# Patient Record
Sex: Female | Born: 1935
Health system: Southern US, Community
[De-identification: ages and names within clinical notes are randomized; demographics above are authoritative.]

## PROBLEM LIST (undated history)

## (undated) DIAGNOSIS — I2510 Atherosclerotic heart disease of native coronary artery without angina pectoris: Secondary | ICD-10-CM

## (undated) DIAGNOSIS — F0390 Unspecified dementia without behavioral disturbance: Secondary | ICD-10-CM

## (undated) DIAGNOSIS — F32A Depression, unspecified: Secondary | ICD-10-CM

## (undated) DIAGNOSIS — R6251 Failure to thrive (child): Secondary | ICD-10-CM

## (undated) DIAGNOSIS — M1990 Unspecified osteoarthritis, unspecified site: Secondary | ICD-10-CM

## (undated) DIAGNOSIS — K219 Gastro-esophageal reflux disease without esophagitis: Secondary | ICD-10-CM

## (undated) DIAGNOSIS — F329 Major depressive disorder, single episode, unspecified: Secondary | ICD-10-CM

## (undated) HISTORY — PX: APPENDECTOMY: SHX54

---

## 2001-11-09 ENCOUNTER — Encounter: Payer: Self-pay | Admitting: Internal Medicine

## 2001-11-09 ENCOUNTER — Encounter: Admission: RE | Admit: 2001-11-09 | Discharge: 2001-11-09 | Payer: Self-pay | Admitting: Internal Medicine

## 2001-11-28 ENCOUNTER — Encounter: Payer: Self-pay | Admitting: Internal Medicine

## 2001-11-28 ENCOUNTER — Encounter: Admission: RE | Admit: 2001-11-28 | Discharge: 2001-11-28 | Payer: Self-pay | Admitting: Internal Medicine

## 2003-07-29 ENCOUNTER — Encounter: Payer: Self-pay | Admitting: Internal Medicine

## 2003-07-29 ENCOUNTER — Encounter: Admission: RE | Admit: 2003-07-29 | Discharge: 2003-07-29 | Payer: Self-pay | Admitting: Internal Medicine

## 2004-02-10 ENCOUNTER — Inpatient Hospital Stay (HOSPITAL_BASED_OUTPATIENT_CLINIC_OR_DEPARTMENT_OTHER): Admission: RE | Admit: 2004-02-10 | Discharge: 2004-02-10 | Payer: Self-pay | Admitting: Cardiology

## 2004-03-04 ENCOUNTER — Inpatient Hospital Stay (HOSPITAL_COMMUNITY): Admission: RE | Admit: 2004-03-04 | Discharge: 2004-03-11 | Payer: Self-pay | Admitting: Surgery

## 2004-07-30 ENCOUNTER — Encounter: Admission: RE | Admit: 2004-07-30 | Discharge: 2004-07-30 | Payer: Self-pay | Admitting: Internal Medicine

## 2004-09-04 ENCOUNTER — Encounter: Admission: RE | Admit: 2004-09-04 | Discharge: 2004-09-04 | Payer: Self-pay | Admitting: Internal Medicine

## 2004-09-04 ENCOUNTER — Encounter (INDEPENDENT_AMBULATORY_CARE_PROVIDER_SITE_OTHER): Payer: Self-pay | Admitting: *Deleted

## 2004-09-11 ENCOUNTER — Encounter (HOSPITAL_COMMUNITY): Admission: RE | Admit: 2004-09-11 | Discharge: 2004-12-10 | Payer: Self-pay | Admitting: General Surgery

## 2004-09-23 ENCOUNTER — Encounter (INDEPENDENT_AMBULATORY_CARE_PROVIDER_SITE_OTHER): Payer: Self-pay | Admitting: General Surgery

## 2004-09-23 ENCOUNTER — Encounter (INDEPENDENT_AMBULATORY_CARE_PROVIDER_SITE_OTHER): Payer: Self-pay | Admitting: Specialist

## 2004-09-23 ENCOUNTER — Ambulatory Visit (HOSPITAL_COMMUNITY): Admission: RE | Admit: 2004-09-23 | Discharge: 2004-09-23 | Payer: Self-pay | Admitting: General Surgery

## 2004-09-23 ENCOUNTER — Ambulatory Visit (HOSPITAL_BASED_OUTPATIENT_CLINIC_OR_DEPARTMENT_OTHER): Admission: RE | Admit: 2004-09-23 | Discharge: 2004-09-23 | Payer: Self-pay | Admitting: General Surgery

## 2005-09-01 ENCOUNTER — Encounter: Admission: RE | Admit: 2005-09-01 | Discharge: 2005-09-01 | Payer: Self-pay | Admitting: General Surgery

## 2006-01-10 ENCOUNTER — Ambulatory Visit (HOSPITAL_COMMUNITY): Admission: RE | Admit: 2006-01-10 | Discharge: 2006-01-10 | Payer: Self-pay | Admitting: Internal Medicine

## 2006-08-31 ENCOUNTER — Encounter: Admission: RE | Admit: 2006-08-31 | Discharge: 2006-08-31 | Payer: Self-pay | Admitting: Internal Medicine

## 2007-09-04 ENCOUNTER — Encounter: Admission: RE | Admit: 2007-09-04 | Discharge: 2007-09-04 | Payer: Self-pay | Admitting: Internal Medicine

## 2008-03-13 ENCOUNTER — Ambulatory Visit: Payer: Self-pay | Admitting: Vascular Surgery

## 2008-04-30 ENCOUNTER — Emergency Department (HOSPITAL_BASED_OUTPATIENT_CLINIC_OR_DEPARTMENT_OTHER): Admission: EM | Admit: 2008-04-30 | Discharge: 2008-05-01 | Payer: Self-pay | Admitting: Emergency Medicine

## 2008-09-06 ENCOUNTER — Encounter: Admission: RE | Admit: 2008-09-06 | Discharge: 2008-09-06 | Payer: Self-pay | Admitting: Internal Medicine

## 2010-03-27 ENCOUNTER — Telehealth: Payer: Self-pay | Admitting: Gastroenterology

## 2010-11-14 ENCOUNTER — Encounter: Payer: Self-pay | Admitting: Vascular Surgery

## 2010-11-26 NOTE — Progress Notes (Signed)
Summary: Schedule NP3 to discuss Colonoscopy  Phone Note Outgoing Call Call back at [facility] Phone [phone]   Call placed by: Harlow Mares CMA Duncan Dull),  March 27, 2010 9:15 AM Call placed to: Patient Summary of Call: patient number rings a fast busy number. he is due for a colonoscopy but due to age he will need a office visit to discuss having his procedure.  Initial call taken by: Harlow Mares CMA Duncan Dull),  March 27, 2010 9:16 AM  Follow-up for Phone Call        pt number is busy  A letter will be mailed to the patient.   Follow-up by: Harlow Mares CMA Duncan Dull),  April 06, 2010 2:20 PM

## 2011-03-12 NOTE — Op Note (Signed)
[name], [name]                  ACCOUNT NO.:  [id number]   MEDICAL RECORD NO.:  [id number]          PATIENT TYPE:  AMB   LOCATION:  DSC                          FACILITY:  [facility]   PHYSICIAN:  Gita Kudo, M.D. DATE OF BIRTH:  05/21/1921   DATE OF PROCEDURE:  09/23/2004  DATE OF DISCHARGE:                                 OPERATIVE REPORT   PREOPERATIVE DIAGNOSIS:  Carcinoma, right breast.   POSTOPERATIVE DIAGNOSIS:  Carcinoma, right breast.   OPERATIVE PROCEDURE:  Right partial mastectomy.   SURGEON:  Gita Kudo, M.D.   ANESTHESIA:  General.   CLINICAL SUMMARY:  An 77 year old female with a lump in her breast, abnormal  mammogram, and a biopsy, core, showing at least DCIS, possible invasive.   OPERATIVE FINDINGS:  There was a firm 2-3 cm mass at about 7 o'clock in the  right breast.  It was removed widely.  I did not encounter the mass and had  a good margin of normal breast tissue in all directions.   OPERATIVE PROCEDURE:  Under satisfactory general endotracheal anesthesia,  the patient's breast was prepped and draped in a standard fashion.  A curved  incision made centered over the mass, which was deep in the breast tissue.  Then cautery was used to remove the mass with a surrounding rim of normal  breast tissue of at least 1-2 cm in all directions.  The specimen was marked  with clips and sent for pathologic exam, permanent.  The wound was then  lavaged with saline, made dry by cautery, and closed in layers with Vicryl  and staples.  A sterile dressing was applied, and she went to the recovery  room in good condition.       MRL/MEDQ  D:  09/23/2004  T:  09/23/2004  Job:  [id number]   cc:   Gwen Pounds, MD  Fax: [phone]

## 2011-03-12 NOTE — Discharge Summary (Signed)
NAMECRISTIANA, [name]                            ACCOUNT NO.:  [id number]   MEDICAL RECORD NO.:  [id number]                   PATIENT TYPE:  INP   LOCATION:  2037                                 FACILITY:  [facility]   PHYSICIAN:  Evelene Croon, M.D.                  DATE OF BIRTH:  05/21/1921   DATE OF ADMISSION:  03/04/2004  DATE OF DISCHARGE:  03/11/2004                           DISCHARGE SUMMARY - REFERRING   REFERRING PHYSICIAN:  Peter M. Swaziland, M.D.   PRIMARY ADMITTING DIAGNOSIS:  Chest pain.   ADDITIONAL/DISCHARGE DIAGNOSES:  1. Severe two-vessel coronary artery disease.  2. Hypertension.  3. Hypercholesterolemia.  4. Osteoporosis.  5. Peptic ulcer disease.  6. History of preoperative right bundle branch block.   PROCEDURES PERFORMED:  1. Coronary artery bypass grafting x2 (left internal mammary artery to the     LAD, saphenous vein graft to the right coronary artery).  2. Endoscopic vein harvest left thigh.   HISTORY OF PRESENT ILLNESS:  The patient is an 77 year old white female who,  in February of 2005, developed an episode of chest pain while shoveling some  snow and ice.  She described it as being substernal anterior chest pain with  numbness radiating into her arms.  This lasted a few minutes and resolved  without difficulty.  Since that time, she has had occasional episodes of  what she thought was indigestion which was relieved with belching.  Her  daughter has noticed that she gets short of breath with exertion.  She  recently underwent EKG which showed a left bundle branch block.  An  adenosine Cardiolite study was also performed and showed evidence of apical  ischemia.  Her left ventricular function was reduced with an ejection  fraction of 43% with severe anterior and apical hypokinesis.  Because of  this, she saw Dr. Peter Swaziland and underwent cardiac catheterization on  February 10, 2004.  She was found to have severe two vessel coronary artery  disease including  an 80 to 90% ostial LAD stenosis and 80 to 90% ostial  right coronary stenosis.  Ejection fraction was 45 to 50%.  She was not felt  to be a good candidate for percutaneous intervention.  She had remained  stable.  She was set up to see Dr. Evelene Croon as an outpatient for  consideration of surgical revascularization.  Dr. Laneta Simmers evaluated the  patient in the office and reviewed her catheterization films.  It was his  opinion that in order to decrease her risks for further ischemia or  infarction and to improve her quality of life, she would benefit from  coronary artery bypass surgery.  The patient and her family consented to  proceed after all the risks, benefits, and alternatives were explained to  them.  She was scheduled for outpatient admission on Mar 04, 2004, for  surgery.   HOSPITAL COURSE:  She was admitted  to [facility] on Mar 04, 2004, and was taken to the operating room where she underwent CABG x2 as  described in detail above.  She tolerated the procedure well and was  transferred to the SICU in stable condition.  She was able to be extubated  shortly after surgery.  She initially was DDD paced.  Her rhythm underlying  the pacer was complete heart block.  She had had a left bundle branch block  preoperatively and had developed postoperative right bundle branch block  which is fairly common postoperatively.  She was seen by cardiology and it  was elected to keep her on the pacer for observation.  She was otherwise  stable and doing well on postoperative day #1.  At that time her chest tubes  and hemodynamic monitoring devices were removed.  She was mobilized and  started on diuresis.  She remained in the ICU for further observation.  She  remained AV paced until postoperative day #3.  At that time she was weaned  from the pacer and it was discontinued.  Since that time, she has had no  further evidence of complete heart block.  She has been maintaining  normal  sinus rhythm.  She has otherwise done very well postoperatively.  Her only  postoperative complication besides her initial heart block has been  hypertension with blood pressures running in the 180 systolic range.  She  was started on Norvasc and her dose has been titrated upward.  She was also  started on Altace.  At the present, her blood pressure is better controlled  and she is maintaining around a 150 systolic.  She has maintained normal  sinus rhythm with rates in the 90s to 100s.  She has otherwise been afebrile  and all vital signs have been stable.  Her surgical incision sites are all  healing well.  She has been ambulating in the halls with cardiac rehab phase  I and is doing very well.  She is tolerating a regular diet and is having  normal bowel and bladder function.  She is diuresing well from her Lasix  dose.  Because she had been previously independent and lived alone prior to  surgery, it was felt that she would most likely benefit from a short course  at an assisted living facility upon discharge from the hospital.  Case  management has been working with her to find suitable placement and it is  felt she will be medically ready on Mar 11, 2004, provided that a bed is  ready at that time.   Most recent lab values showed hemoglobin 9.8, hematocrit 28.9, platelets  133, white count 10.8.  Potassium 4.2, sodium 141, BUN and creatinine 7 and  0.8 respectively.   DISCHARGE MEDICATIONS:  1. Enteric coated aspirin 325 mg daily.  2. Aciphex 30 mg daily.  3. Lipitor 10 mg daily.  4. Evista 60 mg daily.  5. Altace 10 mg daily.  6. Norvasc 5 mg daily.  7. Ultram 50 to 100 mg q.4-6h. p.r.n. for pain.   DISCHARGE INSTRUCTIONS:  1. She is to refrain from driving, heavy lifting, or strenuous activity.  2. She may continue ambulating daily and use her incentive spirometer.  3. She will continue a low fat, low sodium diet. 4. She may shower daily and clean her incisions  with soap and water.   FOLLOW UP:  She will see Dr. Swaziland back in two weeks and is to call and  schedule this appointment.  She will have a chest x-ray at that visit.  She  will then follow up with Evelene Croon, M.D., in three weeks.  She should  bring her chest x-ray to the office for Dr. Laneta Simmers to review.  In the  interim, if she experiences any problems or has questions, our office is to  be contacted immediately.      Coral Ceo, P.A.                        Evelene Croon, M.D.    GC/MEDQ  D:  03/10/2004  T:  03/10/2004  Job:  [id number]   cc:   Peter M. Swaziland, M.D.  [address], [address]  [place], [place] [address]  Fax: [phone]   Gwen Pounds, M.D.  [address]  [place]  [place] [address]  Fax: [phone]

## 2011-03-12 NOTE — H&P (Signed)
NAME:  [name], [name]                            ACCOUNT NO.:  [id number]   MEDICAL RECORD NO.:  [id number]                   PATIENT TYPE:  AMB   LOCATION:                                       FACILITY:  [facility]   PHYSICIAN:  Peter M. Swaziland, M.D.               DATE OF BIRTH:  05/21/1921   DATE OF ADMISSION:  02/10/2004  DATE OF DISCHARGE:                                HISTORY & PHYSICAL   HISTORY OF PRESENT ILLNESS:  [name] is a pleasant 77 year old white  female who presents after having an abnormal Adenosine Cardiolite study. She  states that she developed an episode of chest pain in February while she was  shoveling snow. This was anterior chest pain with numbness radiating into  her arms. She thought it was just indigestion and states that it only lasted  a few minutes. Since then, however, her daughters have noticed that she gets  short of breath when she goes up and down steps. She subsequently saw Dr.  Timothy Lasso at which time ECG showed a left bundle branch block. She was referred  for an Adenosine Cardiolite study, which demonstrated evidence of apical  ischemia. Left ventricular function was reduced with ejection fraction of  43% and severe anterior and apical hypokinesia. Based on these results  recommended that the patient undergo cardiac catheterization. She does have  risk factors of hypertension, hypercholesterolemia, and prior tobacco use.   PAST MEDICAL HISTORY:  1. Hypertension.  2. Hypercholesterolemia.  3. Osteoporosis.  4. History of peptic ulcer disease.  5. Status post appendectomy.  6. Status post oophorectomy.   ALLERGIES:  No known drug allergies.   CURRENT MEDICATIONS:  1. Lipitor 10 mg per day.  2. Evista 60 mg per day.  3. Aciphex 30 mg per day.  4. Altace 10 mg per day.   SOCIAL HISTORY:  The patient is retired. She is widowed. She lives alone and  takes care of her own housework and enjoys doing her own yard work as well.  She quit smoking a  year ago, but states she only smoked about a pack a  month. She denies alcohol use.   FAMILY HISTORY:  The patient is an orphan and does not know her parent's  history. She had one brother who died with Alzheimer's disease. One son has  had coronary artery bypass surgery.   REVIEW OF SYSTEMS:  The patient denies any history of TIA or stroke. She  denies claudication. She has had no edema or orthopnea. Denies any bowel or  bladder complaints. Other review of systems are negative.   PHYSICAL EXAMINATION:  GENERAL:  The patient is an elderly, pleasant, white  female in no apparent distress.  VITAL SIGNS:  Weight is 120, blood pressure 150/80, pulse 64 and regular.  HEENT:  Pupils equal, round, and reactive to light and accommodation. She  has prominent bags  under her eyes that are slightly erythematous.  Conjunctivae are clear. oropharynx is clear.  NECK:  Supple without JVD, adenopathy, thyromegaly, or bruits.  LUNGS:  Clear to auscultation and percussion.  BACK:  Kyphotic.  CARDIAC:  Regular rate and rhythm without gallops, murmurs, rubs, or clicks.  ABDOMEN:  Soft, nontender. No hepatosplenomegaly, masses, or bruits.  EXTREMITIES:  Pedal pulses are 2+ and symmetric.  NEUROLOGIC:  Nonfocal.   LABORATORY DATA:  Chest x-ray shows kyphosis. There is osteoporosis. No  active disease. ECG shows normal sinus rhythm with a left bundle branch  block.   IMPRESSION:  1. Abnormal Adenosine Cardiolite study consistent with apical ischemia.  2. One episode of exertional chest pain.  3. Dyspnea on exertion.  4. Hypertension.  5. Hypercholesterolemia.  6. Prior history of tobacco use.   PLAN:  The patient will undergo cardiac catheterization with further therapy  pending these results.                                                Peter M. Swaziland, M.D.    PMJ/MEDQ  D:  02/04/2004  T:  02/04/2004  Job:  [id number]   cc:   Gwen Pounds, M.D.  [address]  [place]  [place] [address]   Fax: [phone]

## 2011-03-12 NOTE — Cardiovascular Report (Signed)
NAME:  [name], [name]                            ACCOUNT NO.:  [id number]   MEDICAL RECORD NO.:  [id number]                   PATIENT TYPE:  OIB   LOCATION:  6501                                 FACILITY:  [facility]   PHYSICIAN:  Peter M. Swaziland, M.D.               DATE OF BIRTH:  05/21/1921   DATE OF PROCEDURE:  02/10/2004  DATE OF DISCHARGE:  02/10/2004                              CARDIAC CATHETERIZATION   INDICATION FOR PROCEDURE:  The patient is an 77 year old white female who  presents with recent onset of chest pain.  She has a left bundle branch  block.  She has history of hypertension and hypercholesterolemia, prior  history of tobacco abuse.  Recent adenosine Cardiolite study was abnormal  showing marked apical ischemia and ejection fraction of 43%.   ANGIOGRAPHIC DATA:  The left coronary artery arises and distributes  normally.  The left main coronary artery is normal.   The left anterior descending artery has a slit-like stenosis at the ostium  estimated at 80-90%.  The remainder of the vessel is without significant  disease.   The left circumflex coronary artery is a very large vessel which gives rise  to three marginal branches which all appeared to be free of disease.   The right coronary artery is a dominant vessel.  It distributes normal.  It  has an 80-90% ostial stenosis.  The remainder of the vessel is without  significant disease.   Left ventricular angiography performed in the RAO view demonstrates normal  left ventricular size with severe apical hypokinesia.  Overall, left  ventricular systolic function was mildly reduced with ejection fraction  estimated at 45-50%.  There is no mitral regurgitation or prolapse.   Aortic root angiography demonstrates mildly enlarged aortic root without  aortic insufficiency.   FINAL INTERPRETATION:  1. Severe two-vessel obstructive atherosclerotic coronary artery disease     involving the ostium and the left anterior  descending and the ostial     right coronary artery.  2. Mild left ventricular dysfunction.   PLAN:  Given the complexity and position of her severe stenosis, I think she  would be a poor candidate for catheter based intervention.  Will consider a  coronary artery bypass graft.   ACCESS:  Via the right femoral artery using standard Seldinger technique.   EQUIPMENT:  5 French 4 cm right and left Judkins catheter, 5 French pigtail  catheter, 5 French arterial sheath.   MEDICATIONS:  Local anesthesia with 1% Xylocaine, Versed 1 mg IV.   CONTRAST:  150 mL of Omnipaque.   HEMODYNAMIC DATA:  Aortic pressure 199/74 with mean of 122.  Left  ventricular pressure was 203 with EDP of 13 mmHg.  Peter M. Swaziland, M.D.    PMJ/MEDQ  D:  02/10/2004  T:  02/10/2004  Job:  [id number]   cc:   Gwen Pounds, M.D.  [address]  [place]  [place] [address]  Fax: [phone]   CVTS

## 2011-03-12 NOTE — Op Note (Signed)
[name], [name]                            ACCOUNT NO.:  [id number]   MEDICAL RECORD NO.:  [id number]                   PATIENT TYPE:  INP   LOCATION:  2316                                 FACILITY:  [facility]   PHYSICIAN:  Evelene Croon, M.D.                  DATE OF BIRTH:  05/21/1921   DATE OF PROCEDURE:  03/04/2004  DATE OF DISCHARGE:                                 OPERATIVE REPORT   PREOPERATIVE DIAGNOSIS:  Severe two vessel coronary artery disease.   POSTOPERATIVE DIAGNOSIS:  Severe two vessel coronary artery disease.   PROCEDURE:  Median sternotomy, extracorporal peripheral circulation,  coronary artery bypass graft surgery x2 using a left internal mammary artery  graft to the left anterior descending coronary artery, with a saphenous vein  graft to the right coronary artery.  Endoscopic vein harvesting from the  left leg.   ATTENDING SURGEON:  Dr. Evelene Croon.   ASSISTANT:  Shonna Chock, P.A.-C.   ANESTHESIA:  General endotracheal.   CLINICAL HISTORY:  This patient is an 77 year old active white female who  developed chest pain in February while shoveling some snow and ice.  She  subsequently has had further episodes of chest pain.  An Adenosine  Cardiolite scan showed evidence of apical ischemia.  Left ventricular  function was reduced with an ejection fraction of 43% with severe anterior  and apical hypokinesis.  She subsequently underwent cardiac catheterization  on February 10, 2004.  This showed severe two vessel disease with 80-90% osteal  LAD stenosis and an 80-90% osteal right coronary artery stenosis.  Ejection  fraction was 45-50%.  There was no mitral regurgitation and no significant  aortic stenosis.  After review of the angiogram and examination of the  patient, it was felt that coronary artery bypass graft surgery was the best  treatment.  I discussed the operative procedure with the patient and her  daughter and granddaughter including alternatives,  benefits, and risks  including bleeding, blood transfusion, infection, stroke, myocardial  infarction, graft failure, and death.  They understood and agreed to  proceed.   OPERATIVE PROCEDURE:  The patient was taken to the operating room and placed  on the table in the supine position.  After induction of general  endotracheal anesthesia, a Foley catheter was placed in the bladder using  sterile technique.  Then the chest, abdomen and both lower extremities were  prepped and draped in the usual sterile manner.  The chest was entered  through a median sternotomy incision and the pericardium opened in the  midline.  Examination of the heart showed good ventricular contractility.  The ascending aorta had no palpable plaques in it.   Then the left internal mammary artery was harvested from the chest wall as a  pedicle graft.  This was a medium caliber vessel with excellent blood flow  through it.  At the same time, a 7  mm greater saphenous vein was harvested  from the left leg using endoscopic vein harvest technique.  This vein was a  medium size and good quality.   Then the patient was heparinized and when an adequate activated clotting  time was achieved, the distal ascending aorta was cannulated using a 20  French aortic cannula for arterial inflow.  Venous outflow was achieved  using a two stage venous cannula through the right atrial appendage.  An  antegrade cardioplegia and bent cannula was inserted in the aortic root.   The patient was placed on cardiopulmonary bypass and the distal coronary was  identified.  The LAD was a large graftable vessel proximally and then  narrowed in its mid and distal portions.  There was no significant distal  disease in it.  The right coronary artery was a large graftable vessel that  had some patchy plaque in the mid portion.   Then the aorta was cross clamped and 500 cc of cold blood antegrade  cardioplegia was administered into the aortic root  with quick arrest of the  heart.  Systemic hypothermia to 20 degrees Centigrade and topical  hypothermic ice saline was used.  A temperature probe was placed in the  septum, insulated and padded in the pericardium.   The first distal anastomosis was performed to the mid portion of the right  coronary artery.  The internal diameter of this vessel was greater than 2.5  mm.  The conduit used was a 7 mm greater saphenous vein.  The anastomosis  was performed in an end-to-side manner using continuous 7-0 Prolene suture.  Flow was admitted through the graft and was excellent.   The second distal anastomosis was performed of the mid portion of the left  anterior descending coronary artery.  The internal diameter of this vessel  was about 2 mm.  The conduit used was a left internal mammary graft and this  was brought through an opening in the left pericardium anterior to the  phrenic nerve.  It was anastomosed to the LAD in an end-to-side manner using  continuous 8-0 Prolene suture.  The pedicle was tacked to the epicardium  with 6-0 Prolene sutures.  The patient was rewarmed to 37 degrees  Centigrade.  Then the single proximal vein graft anastomosis was performed  at the aortic root in an end-to-side manner using continuous 6-0 Prolene  suture.  The clamp was removed, the vein graft de-aired and clamps removed  from them.  The proximal and distal anastomosis appeared hemostatic and the  lie of the grafts satisfactory.  Graft markers were placed around the  proximal anastomosis.  Two temporary right ventricular and right atrial  pacing wires were placed and brought out through the skin.   When the patient had rewarmed to 37 degrees Centigrade, she was weaned from  cardiopulmonary bypass on no inotropic agents.  The total bypass time was 59  minutes.  Protamine was given and the venous and aortic cannulas were removed without difficulty.  Hemostasis was achieved.  Three chest tubes  were placed  with two in the posterior pericardium, one in the left pleural  space, one in the anterior mediastinum.  The pericardium was reapproximated  over the heart.  The sternum was closed with #6 stainless steel wires.  The  fascia was closed with continuous #1 Vicryl suture.  The subcutaneous tissue  was closed with continuous 2-0 Vicryl and the skin with 3-0 Vicryl  subcuticular closure.  The lower extremity vein  harvest site was closed in  layers in a similar manner.  The sponge, needle and instrument counts were  correct per the scrub nurse.  Dry sterile dressing were applied over the  incisions and around the chest tube which were hooked to Pleur-evac suction.  The patient remained hemodynamically stable and was transported to the SICU  in guarded but stable condition.                                               Evelene Croon, M.D.    BB/MEDQ  D:  03/04/2004  T:  03/05/2004  Job:  [id number]   cc:   Peter M. Swaziland, M.D.  [address], [address]  [place], [place] [address]  Fax: [phone]   Cardiac catherization lab at [facility]

## 2012-02-19 ENCOUNTER — Encounter (HOSPITAL_COMMUNITY): Payer: Self-pay | Admitting: *Deleted

## 2012-02-19 ENCOUNTER — Emergency Department (HOSPITAL_COMMUNITY)
Admission: EM | Admit: 2012-02-19 | Discharge: 2012-02-19 | Disposition: A | Discharge status: Home / Self Care | Payer: Medicare Other | Attending: Emergency Medicine | Admitting: Emergency Medicine

## 2012-02-19 DIAGNOSIS — R609 Edema, unspecified: Secondary | ICD-10-CM | POA: Insufficient documentation

## 2012-02-19 DIAGNOSIS — L02419 Cutaneous abscess of limb, unspecified: Secondary | ICD-10-CM | POA: Insufficient documentation

## 2012-02-19 DIAGNOSIS — M79609 Pain in unspecified limb: Secondary | ICD-10-CM

## 2012-02-19 DIAGNOSIS — L03115 Cellulitis of right lower limb: Secondary | ICD-10-CM

## 2012-02-19 DIAGNOSIS — K219 Gastro-esophageal reflux disease without esophagitis: Secondary | ICD-10-CM | POA: Insufficient documentation

## 2012-02-19 HISTORY — DX: Atherosclerotic heart disease of native coronary artery without angina pectoris: I25.10

## 2012-02-19 HISTORY — DX: Failure to thrive (child): R62.51

## 2012-02-19 HISTORY — DX: Unspecified dementia, unspecified severity, without behavioral disturbance, psychotic disturbance, mood disturbance, and anxiety: F03.90

## 2012-02-19 HISTORY — DX: Depression, unspecified: F32.A

## 2012-02-19 HISTORY — DX: Major depressive disorder, single episode, unspecified: F32.9

## 2012-02-19 HISTORY — DX: Gastro-esophageal reflux disease without esophagitis: K21.9

## 2012-02-19 MED ORDER — MORPHINE SULFATE 4 MG/ML IJ SOLN
6.0000 mg | Freq: Once | INTRAMUSCULAR | Status: DC
  Filled 2012-02-19: qty 1

## 2012-02-19 MED ORDER — SODIUM CHLORIDE 0.9 % IV BOLUS (SEPSIS)
500.0000 mL | Freq: Once | INTRAVENOUS | Status: AC
  Administered 2012-02-19: 500 mL via INTRAVENOUS

## 2012-02-19 MED ORDER — VANCOMYCIN HCL IN DEXTROSE 1-5 GM/200ML-% IV SOLN
1000.0000 mg | Freq: Once | INTRAVENOUS | Status: AC
  Administered 2012-02-19: 1000 mg via INTRAVENOUS
  Filled 2012-02-19 (×2): qty 200

## 2012-02-19 MED ORDER — ONDANSETRON HCL 4 MG/2ML IJ SOLN
4.0000 mg | Freq: Once | INTRAMUSCULAR | Status: AC
  Administered 2012-02-19: 4 mg via INTRAVENOUS
  Filled 2012-02-19: qty 2

## 2012-02-19 MED ORDER — SULFAMETHOXAZOLE-TRIMETHOPRIM 800-160 MG PO TABS: 1.0000 | ORAL_TABLET | Freq: Two times a day (BID) | ORAL | Status: AC

## 2012-02-19 MED ORDER — MORPHINE SULFATE 4 MG/ML IJ SOLN
4.0000 mg | Freq: Once | INTRAMUSCULAR | Status: AC
  Administered 2012-02-19: 4 mg via INTRAVENOUS

## 2012-02-19 NOTE — ED Provider Notes (Signed)
History    89yF with RLE pain. Gradual onset yesterday evening.  Partially worsening. Pain is in the right shin area and does not radiate. Patient denies trauma. Mild swelling. Mild rash in the area. No fever or chills. No acute numbness, weakness or loss of strength.  Pt with chronic LE pain but current complaints is acutely worse. Worse with ambulation and palpation. Recent RLE swelling which resolved. Had outpt [place] to eval for this and negative per family.   CSN: [id number]  Arrival date & time 02/19/12  1100   First MD Initiated Contact with Patient 02/19/12 1138      Chief Complaint  Patient presents with  . Leg Pain    (Consider location/radiation/quality/duration/timing/severity/associated sxs/prior treatment) Patient is a 78 y.o. female presenting with leg pain. The history is provided by the patient and a relative. History Limited By: dementia. No language interpreter was used.  Leg Pain  The incident occurred yesterday. There was no injury mechanism. The pain is present in the right leg. The quality of the pain is described as aching. The pain is moderate. The pain has been constant since onset. Pertinent negatives include no inability to bear weight and no muscle weakness. The symptoms are aggravated by bearing weight.    Past Medical History  Diagnosis Date  . Depression   . GERD (gastroesophageal reflux disease)   . Dementia   . CAD (coronary artery disease)   . Cardiomyopathy   . Osteoporosis   . Constipation   . Failure to thrive     History reviewed. No pertinent past surgical history.  History reviewed. No pertinent family history.  History  Substance Use Topics  . Smoking status: Not on file  . Smokeless tobacco: Not on file  . Alcohol Use:     OB History    Grav Para Term Preterm Abortions TAB SAB Ect Mult Living                  Review of Systems   Review of symptoms negative unless otherwise noted in HPI.   Allergies  Review of patient's  allergies indicates no known allergies.  Home Medications   Current Outpatient Rx  Name Route Sig Dispense Refill  . ACETAMINOPHEN 325 MG PO TABS Oral Take 325-650 mg by mouth every 6 (six) hours as needed. For pain.    . ASPIRIN 81 MG PO CHEW Oral Chew 81 mg by mouth every morning.    Marland Kitchen CLOTRIMAZOLE-BETAMETHASONE 1-0.05 % EX CREA Topical Apply 1 application topically 2 (two) times daily. Applies under breasts until resolved.    . DONEPEZIL HCL 5 MG PO TABS Oral Take 5 mg by mouth at bedtime.    . FUROSEMIDE 20 MG PO TABS Oral Take 20 mg by mouth every morning.    Marland Kitchen HALOPERIDOL 1 MG PO TABS Oral Take 1 mg by mouth at bedtime.    Marland Kitchen LOSARTAN POTASSIUM 50 MG PO TABS Oral Take 50 mg by mouth every morning.    Marland Kitchen OMEPRAZOLE 20 MG PO CPDR Oral Take 20 mg by mouth every morning.    Marland Kitchen RALOXIFENE HCL 60 MG PO TABS Oral Take 60 mg by mouth every morning.    Marland Kitchen SERTRALINE HCL 25 MG PO TABS Oral Take 25 mg by mouth at bedtime.      BP 174/61  Pulse 86  Temp(Src) 98.8 F (37.1 C) (Oral)  Resp 18  SpO2 97%  Physical Exam  Nursing note and vitals reviewed. Constitutional: She appears  well-developed and well-nourished. No distress.       Laying in bed. NAD. Pleasantly demented.  HENT:  Head: Normocephalic and atraumatic.  Eyes: Conjunctivae are normal. Right eye exhibits no discharge. Left eye exhibits no discharge.  Neck: Neck supple.  Cardiovascular: Normal rate, regular rhythm and normal heart sounds.  Exam reveals no gallop and no friction rub.   No murmur heard. Pulmonary/Chest: Effort normal and breath sounds normal. No respiratory distress.  Abdominal: Soft. She exhibits no distension. There is no tenderness.  Musculoskeletal: She exhibits edema and tenderness.       Mild edema of RLE below knee. Mild erythema of proximal shin and extending laterally with tenderness and increased warmth. Extends from area of tibial tuberosity to distal third of tib/fib. No knee effusion or tenderness. No  significant pain with ROM of knee.  Neurological: She is alert.  Skin: Skin is warm and dry. She is not diaphoretic.  Psychiatric: She has a normal mood and affect. Her behavior is normal. Thought content normal.    ED Course  Procedures (including critical care time)  Labs Reviewed - No data to display No results found.   1. Cellulitis of right leg       MDM  89yF with RLE pain. Suspect cellulitis. No hx of trauma. Pt with small abrasion overlying bunion R foot and recent "scraping between her toes by foot doctor" which would be portal of entry. Recent swelling RLE particularly around feet which has improved per grand daughter. Has [place] RLE in evaluation of this about 4w ago which was normal per their report. Will repeat US to eval for DVT, although feel less likely. Do not feel XR or other imaging of much utility at this time. IV for pain meds and dose IV abx if [place] neg. Feel that can be appropriately be discharged for tx as an outpatient at this time.        Raeford Razor, MD 02/19/12 [phone]

## 2012-02-19 NOTE — ED Notes (Signed)
Pt in via EMS, c/o right leg pain and left leg pain, denies fall or injury, states she has continued ambulating during pain, states she has chronic left leg pain, denies arm pain

## 2012-02-19 NOTE — Progress Notes (Signed)
VASCULAR LAB PRELIMINARY  PRELIMINARY  PRELIMINARY  PRELIMINARY  Right lower extremity venous Doppler completed.    Preliminary report:  There is no DVT or SVT noted in the right lower extremity.  Sherren Kerns [place], 02/19/2012, 1:14 PM

## 2012-02-19 NOTE — ED Notes (Signed)
Bed:WA10<BR> Expected date:<BR> Expected time:<BR> Means of arrival:<BR> Comments:<BR>

## 2012-02-19 NOTE — Discharge Instructions (Signed)
Cellulitis Cellulitis is an infection of the tissue under the skin. The infected area is usually red and tender. This is caused by germs. These germs enter the body through cuts or sores. This usually happens in the arms or lower legs. HOME CARE   Take your medicine as told. Finish it even if you start to feel better.   If the infection is on the arm or leg, keep it raised (elevated).   Use a warm cloth on the infected area several times a day.   See your doctor for a follow-up visit as told.  GET HELP RIGHT AWAY IF:   You are tired or confused.   You throw up (vomit).   You have watery poop (diarrhea).   You feel ill and have muscle aches.   You have a fever.  MAKE SURE YOU:   Understand these instructions.   Will watch your condition.   Will get help right away if you are not doing well or get worse.  Document Released: 03/29/2008 Document Revised: 09/30/2011 Document Reviewed: 09/12/2009 [facility] Patient Information 2012 [place], [place].  RESOURCE GUIDE  Dental Problems  Patients with Medicaid: [facility] [phone] [address]                                           [phone] [address] Phone:  [phone]                                                  Phone:  [phone]  If unable to pay or uninsured, contact:  [facility] or [facility]. to become qualified for the adult dental clinic.  Chronic Pain Problems Contact [facility]  [phone] Patients need to be referred by their primary care doctor.  Insufficient Money for Medicine Contact [employer]:  call "211" or [facility] [phone].  No Primary Care Doctor Call [facility]  [phone] Other agencies that provide inexpensive medical care    [facility]  [phone]    [facility]  [phone]    [facility]  [phone]    [facility]  [phone]    [facility]   [phone]    [facility]  [phone]  Psychological Services [facility]  [phone] [facility]  [phone] [facility]   [phone] (emergency services [phone])  Substance Abuse Resources [facility]  [phone] [facility] [phone] [facility] [place] [phone] Floydene Flock [phone] Residential & Outpatient Substance Abuse Program  [phone]  Abuse/Neglect [facility] Child Abuse Hotline [phone] [facility] Child Abuse Hotline [phone] (After Hours)  Emergency Shelter [facility] [phone]  Maternity Homes Room at the [place] [facility] [place] [phone] [facility] [phone]  MRSA Hotline #:   575-645-0572    [facility]  [facility] Dept. [address]      [place] Phone:  [phone]                                   Phone:  [phone]                 Phone:  [place] Phone:  [place] [phone] (After Hours)

## 2012-02-19 NOTE — ED Notes (Signed)
Urine in lab.  Lab called and acknowledged

## 2012-03-28 ENCOUNTER — Encounter: Payer: Self-pay | Admitting: *Deleted

## 2012-05-09 ENCOUNTER — Encounter: Payer: Self-pay | Admitting: Gastroenterology

## 2012-08-03 ENCOUNTER — Encounter: Payer: Self-pay | Admitting: Cardiology

## 2012-12-07 ENCOUNTER — Emergency Department (HOSPITAL_COMMUNITY): Payer: Medicare Other

## 2012-12-07 ENCOUNTER — Inpatient Hospital Stay (HOSPITAL_COMMUNITY)
Admission: EM | Admit: 2012-12-07 | Discharge: 2012-12-12 | DRG: 603 | Disposition: A | Discharge status: Home Health | Payer: Medicare Other | Attending: Internal Medicine | Admitting: Internal Medicine

## 2012-12-07 ENCOUNTER — Encounter (HOSPITAL_COMMUNITY): Payer: Self-pay | Admitting: *Deleted

## 2012-12-07 DIAGNOSIS — I2510 Atherosclerotic heart disease of native coronary artery without angina pectoris: Secondary | ICD-10-CM | POA: Diagnosis present

## 2012-12-07 DIAGNOSIS — Z853 Personal history of malignant neoplasm of breast: Secondary | ICD-10-CM

## 2012-12-07 DIAGNOSIS — K5900 Constipation, unspecified: Secondary | ICD-10-CM | POA: Diagnosis present

## 2012-12-07 DIAGNOSIS — L02619 Cutaneous abscess of unspecified foot: Principal | ICD-10-CM | POA: Diagnosis present

## 2012-12-07 DIAGNOSIS — I10 Essential (primary) hypertension: Secondary | ICD-10-CM | POA: Diagnosis present

## 2012-12-07 DIAGNOSIS — L03039 Cellulitis of unspecified toe: Principal | ICD-10-CM | POA: Diagnosis present

## 2012-12-07 DIAGNOSIS — M869 Osteomyelitis, unspecified: Secondary | ICD-10-CM

## 2012-12-07 DIAGNOSIS — L03116 Cellulitis of left lower limb: Secondary | ICD-10-CM

## 2012-12-07 DIAGNOSIS — Z66 Do not resuscitate: Secondary | ICD-10-CM | POA: Diagnosis present

## 2012-12-07 DIAGNOSIS — K219 Gastro-esophageal reflux disease without esophagitis: Secondary | ICD-10-CM | POA: Diagnosis present

## 2012-12-07 DIAGNOSIS — I428 Other cardiomyopathies: Secondary | ICD-10-CM | POA: Diagnosis present

## 2012-12-07 DIAGNOSIS — F329 Major depressive disorder, single episode, unspecified: Secondary | ICD-10-CM | POA: Diagnosis present

## 2012-12-07 DIAGNOSIS — F0390 Unspecified dementia without behavioral disturbance: Secondary | ICD-10-CM | POA: Diagnosis present

## 2012-12-07 DIAGNOSIS — E785 Hyperlipidemia, unspecified: Secondary | ICD-10-CM | POA: Diagnosis present

## 2012-12-07 DIAGNOSIS — I509 Heart failure, unspecified: Secondary | ICD-10-CM | POA: Diagnosis present

## 2012-12-07 DIAGNOSIS — D649 Anemia, unspecified: Secondary | ICD-10-CM | POA: Diagnosis present

## 2012-12-07 DIAGNOSIS — F3289 Other specified depressive episodes: Secondary | ICD-10-CM | POA: Diagnosis present

## 2012-12-07 DIAGNOSIS — Z951 Presence of aortocoronary bypass graft: Secondary | ICD-10-CM

## 2012-12-07 DIAGNOSIS — N179 Acute kidney failure, unspecified: Secondary | ICD-10-CM | POA: Diagnosis present

## 2012-12-07 LAB — BASIC METABOLIC PANEL
BUN: 32 mg/dL — ABNORMAL HIGH (ref 6–23)
CO2: 23 mEq/L (ref 19–32)
Calcium: 8.7 mg/dL (ref 8.4–10.5)
Chloride: 97 mEq/L (ref 96–112)
Creatinine, Ser: 1.56 mg/dL — ABNORMAL HIGH (ref 0.50–1.10)
GFR calc Af Amer: 33 mL/min — ABNORMAL LOW (ref >90)
GFR calc non Af Amer: 28 mL/min — ABNORMAL LOW (ref >90)
Glucose, Bld: 92 mg/dL (ref 70–99)
Potassium: 3.4 mEq/L — ABNORMAL LOW (ref 3.5–5.1)
Sodium: 133 mEq/L — ABNORMAL LOW (ref 135–145)

## 2012-12-07 LAB — CBC
HCT: 33.6 % — ABNORMAL LOW (ref 36.0–46.0)
Hemoglobin: 11.1 g/dL — ABNORMAL LOW (ref 12.0–15.0)
MCH: 31.1 pg (ref 26.0–34.0)
MCHC: 33 g/dL (ref 30.0–36.0)
MCV: 94.1 fL (ref 78.0–100.0)
Platelets: 219 10*3/uL (ref 150–400)
RBC: 3.57 MIL/uL — ABNORMAL LOW (ref 3.87–5.11)
RDW: 13.9 % (ref 11.5–15.5)
WBC: 8.8 10*3/uL (ref 4.0–10.5)

## 2012-12-07 MED ORDER — FUROSEMIDE 20 MG PO TABS: 20.0000 mg | ORAL_TABLET | Freq: Two times a day (BID) | ORAL | Status: DC

## 2012-12-07 MED ORDER — SODIUM CHLORIDE 0.9 % IV SOLN: INTRAVENOUS | Status: DC

## 2012-12-07 MED ORDER — HYDROCODONE-ACETAMINOPHEN 5-325 MG PO TABS
1.0000 | ORAL_TABLET | ORAL | Status: DC | PRN
  Administered 2012-12-08 – 2012-12-11 (×5): 1 via ORAL
  Filled 2012-12-07: qty 2
  Filled 2012-12-07 (×4): qty 1

## 2012-12-07 MED ORDER — SERTRALINE HCL 25 MG PO TABS
25.0000 mg | ORAL_TABLET | Freq: Every day | ORAL | Status: DC
  Administered 2012-12-07 – 2012-12-11 (×5): 25 mg via ORAL
  Filled 2012-12-07 (×6): qty 1

## 2012-12-07 MED ORDER — VANCOMYCIN HCL 1000 MG IV SOLR
750.0000 mg | INTRAVENOUS | Status: DC
  Administered 2012-12-08: 750 mg via INTRAVENOUS
  Filled 2012-12-07: qty 750

## 2012-12-07 MED ORDER — MORPHINE SULFATE 4 MG/ML IJ SOLN
4.0000 mg | Freq: Once | INTRAMUSCULAR | Status: AC
  Administered 2012-12-07: 4 mg via INTRAVENOUS
  Filled 2012-12-07: qty 1

## 2012-12-07 MED ORDER — LOSARTAN POTASSIUM 50 MG PO TABS
50.0000 mg | ORAL_TABLET | Freq: Every morning | ORAL | Status: DC
  Administered 2012-12-08 – 2012-12-12 (×5): 50 mg via ORAL
  Filled 2012-12-07 (×5): qty 1

## 2012-12-07 MED ORDER — POLYETHYLENE GLYCOL 3350 17 G PO PACK
17.0000 g | PACK | Freq: Every day | ORAL | Status: DC | PRN
  Filled 2012-12-07: qty 1

## 2012-12-07 MED ORDER — ACETAMINOPHEN 325 MG PO TABS: 650.0000 mg | ORAL_TABLET | Freq: Four times a day (QID) | ORAL | Status: DC | PRN

## 2012-12-07 MED ORDER — MUPIROCIN 2 % EX OINT
1.0000 "application " | TOPICAL_OINTMENT | Freq: Two times a day (BID) | CUTANEOUS | Status: DC
  Administered 2012-12-07 – 2012-12-11 (×9): 1 via NASAL
  Filled 2012-12-07: qty 22

## 2012-12-07 MED ORDER — FUROSEMIDE 20 MG PO TABS
20.0000 mg | ORAL_TABLET | ORAL | Status: DC
  Administered 2012-12-08 – 2012-12-12 (×5): 20 mg via ORAL
  Filled 2012-12-07 (×6): qty 1

## 2012-12-07 MED ORDER — ALUM & MAG HYDROXIDE-SIMETH 200-200-20 MG/5ML PO SUSP: 30.0000 mL | Freq: Four times a day (QID) | ORAL | Status: DC | PRN

## 2012-12-07 MED ORDER — VANCOMYCIN HCL IN DEXTROSE 1-5 GM/200ML-% IV SOLN
1000.0000 mg | Freq: Once | INTRAVENOUS | Status: AC
  Administered 2012-12-07: 1000 mg via INTRAVENOUS
  Filled 2012-12-07: qty 200

## 2012-12-07 MED ORDER — CHLORHEXIDINE GLUCONATE CLOTH 2 % EX PADS
6.0000 | MEDICATED_PAD | Freq: Every day | CUTANEOUS | Status: DC
  Administered 2012-12-08 – 2012-12-11 (×4): 6 via TOPICAL

## 2012-12-07 MED ORDER — PANTOPRAZOLE SODIUM 40 MG PO TBEC
40.0000 mg | DELAYED_RELEASE_TABLET | Freq: Every day | ORAL | Status: DC
  Administered 2012-12-08 – 2012-12-12 (×5): 40 mg via ORAL
  Filled 2012-12-07 (×5): qty 1

## 2012-12-07 MED ORDER — SODIUM CHLORIDE 0.9 % IV SOLN
INTRAVENOUS | Status: DC
  Administered 2012-12-07: 250 mL via INTRAVENOUS
  Administered 2012-12-07: 1000 mL via INTRAVENOUS
  Administered 2012-12-08 – 2012-12-10 (×4): via INTRAVENOUS

## 2012-12-07 MED ORDER — ACETAMINOPHEN 650 MG RE SUPP: 650.0000 mg | Freq: Four times a day (QID) | RECTAL | Status: DC | PRN

## 2012-12-07 MED ORDER — DOCUSATE SODIUM 100 MG PO CAPS
100.0000 mg | ORAL_CAPSULE | Freq: Two times a day (BID) | ORAL | Status: DC
  Administered 2012-12-07 – 2012-12-10 (×7): 100 mg via ORAL
  Filled 2012-12-07 (×11): qty 1

## 2012-12-07 MED ORDER — PIPERACILLIN-TAZOBACTAM 3.375 G IVPB
3.3750 g | Freq: Three times a day (TID) | INTRAVENOUS | Status: DC
  Administered 2012-12-07 – 2012-12-08 (×2): 3.375 g via INTRAVENOUS
  Filled 2012-12-07 (×3): qty 50

## 2012-12-07 MED ORDER — RALOXIFENE HCL 60 MG PO TABS
60.0000 mg | ORAL_TABLET | Freq: Every morning | ORAL | Status: DC
  Administered 2012-12-08 – 2012-12-12 (×5): 60 mg via ORAL
  Filled 2012-12-07 (×5): qty 1

## 2012-12-07 MED ORDER — SENNA 8.6 MG PO TABS
1.0000 | ORAL_TABLET | Freq: Two times a day (BID) | ORAL | Status: DC
  Administered 2012-12-07 – 2012-12-10 (×6): 8.6 mg via ORAL
  Filled 2012-12-07 (×6): qty 1

## 2012-12-07 MED ORDER — PIPERACILLIN-TAZOBACTAM 3.375 G IVPB 30 MIN
3.3750 g | Freq: Once | INTRAVENOUS | Status: AC
  Administered 2012-12-07: 3.375 g via INTRAVENOUS
  Filled 2012-12-07: qty 50

## 2012-12-07 MED ORDER — SODIUM CHLORIDE 0.9 % IJ SOLN
3.0000 mL | Freq: Two times a day (BID) | INTRAMUSCULAR | Status: DC
  Administered 2012-12-09 – 2012-12-11 (×3): 3 mL via INTRAVENOUS

## 2012-12-07 MED ORDER — HALOPERIDOL 1 MG PO TABS
1.0000 mg | ORAL_TABLET | Freq: Every day | ORAL | Status: DC
  Administered 2012-12-07 – 2012-12-11 (×5): 1 mg via ORAL
  Filled 2012-12-07 (×6): qty 1

## 2012-12-07 MED ORDER — ASPIRIN EC 81 MG PO TBEC
81.0000 mg | DELAYED_RELEASE_TABLET | Freq: Every day | ORAL | Status: DC
  Administered 2012-12-08 – 2012-12-12 (×5): 81 mg via ORAL
  Filled 2012-12-07 (×6): qty 1

## 2012-12-07 MED ORDER — ONDANSETRON HCL 4 MG/2ML IJ SOLN: 4.0000 mg | Freq: Four times a day (QID) | INTRAMUSCULAR | Status: DC | PRN

## 2012-12-07 MED ORDER — FUROSEMIDE 40 MG PO TABS
40.0000 mg | ORAL_TABLET | ORAL | Status: DC
  Administered 2012-12-08 – 2012-12-12 (×4): 40 mg via ORAL
  Filled 2012-12-07 (×6): qty 1

## 2012-12-07 MED ORDER — DONEPEZIL HCL 5 MG PO TABS
5.0000 mg | ORAL_TABLET | Freq: Every day | ORAL | Status: DC
  Administered 2012-12-07 – 2012-12-11 (×5): 5 mg via ORAL
  Filled 2012-12-07 (×6): qty 1

## 2012-12-07 MED ORDER — ONDANSETRON HCL 4 MG/2ML IJ SOLN
4.0000 mg | Freq: Once | INTRAMUSCULAR | Status: AC
  Administered 2012-12-07: 4 mg via INTRAVENOUS
  Filled 2012-12-07: qty 2

## 2012-12-07 MED ORDER — ONDANSETRON HCL 4 MG/2ML IJ SOLN: 4.0000 mg | Freq: Three times a day (TID) | INTRAMUSCULAR | Status: DC | PRN

## 2012-12-07 MED ORDER — SODIUM CHLORIDE 0.9 % IV BOLUS (SEPSIS)
500.0000 mL | Freq: Once | INTRAVENOUS | Status: AC
  Administered 2012-12-07: 14:00:00 via INTRAVENOUS

## 2012-12-07 MED ORDER — ONDANSETRON HCL 4 MG PO TABS: 4.0000 mg | ORAL_TABLET | Freq: Four times a day (QID) | ORAL | Status: DC | PRN

## 2012-12-07 NOTE — Progress Notes (Signed)
ANTIBIOTIC CONSULT NOTE - INITIAL  Pharmacy Consult for Vancomycin and Zosyn Indication: Osteomyelitis  Allergies  Allergen Reactions  . Tramadol    Patient Measurements: Height: 5\' 4"  (162.6 cm) Weight: 121 lb (54.885 kg) IBW/kg (Calculated) : 54.7  Vital Signs: Temp: 98 F (36.7 C) (02/13 1541) Temp src: Oral (02/13 1541) BP: 142/62 mmHg (02/13 1541) Pulse Rate: 91 (02/13 1541) Intake/Output from previous day:   Intake/Output from this shift:    Labs:  Recent Labs  12/07/12 1300  WBC 8.8  HGB 11.1*  PLT 219  CREATININE 1.56*   Estimated Creatinine Clearance: 20.7 ml/min (by C-G formula based on Cr of 1.56). No results found for this basename: VANCOTROUGH, VANCOPEAK, VANCORANDOM, GENTTROUGH, GENTPEAK, GENTRANDOM, TOBRATROUGH, TOBRAPEAK, TOBRARND, AMIKACINPEAK, AMIKACINTROU, AMIKACIN,  in the last 72 hours   Microbiology: No results found for this or any previous visit (from the past 720 hour(s)).  Medical History: Past Medical History  Diagnosis Date  . Depression   . GERD (gastroesophageal reflux disease)   . Dementia   . CAD (coronary artery disease)   . Cardiomyopathy   . Osteoporosis   . Constipation   . Failure to thrive in childhood    Medications:  Anti-infectives   Start     Dose/Rate Route Frequency Ordered Stop   12/07/12 1300  vancomycin (VANCOCIN) IVPB 1000 mg/200 mL premix     1,000 mg 200 mL/hr over 60 Minutes Intravenous  Once 12/07/12 1246 12/07/12 1505   12/07/12 1300  piperacillin-tazobactam (ZOSYN) IVPB 3.375 g     3.375 g 100 mL/hr over 30 Minutes Intravenous  Once 12/07/12 1246 12/07/12 1434     Assessment:  79yo F admitted from NH with L 4th toe wound.  Starting empiric Vanc and Zosyn for suspected osteomyelitis.  Elevated SCr thought to have pre-renal component.  Goal of Therapy:  Vancomycin trough level 15-20 mcg/ml  Plan:  Vancomycin 750mg  IV q24h, starting 2/14. Zosyn 3.375g IV Q8H infused over 4hrs. Measure Vanc  trough at steady state. Follow up renal fxn and culture results.  Charolotte Eke, PharmD, pager [phone]. 12/07/2012,4:10 PM.

## 2012-12-07 NOTE — ED Provider Notes (Signed)
History    89yf with L foot pain and swelling. Pt reports ulceration to top of fourth toe L foot from rubbing on shoe. Onset about a week ago. Initially just localized pain, but in last couple days increasing swelling, pain and redness in foot and ankle. No fever or chills. No n/v. No hx of diabetes.    CSN: [id number]  Arrival date & time 12/07/12  1225   First MD Initiated Contact with Patient 12/07/12 1230      Chief Complaint  Patient presents with  . Leg Swelling    (Consider location/radiation/quality/duration/timing/severity/associated sxs/prior treatment) HPI  Past Medical History  Diagnosis Date  . Depression   . GERD (gastroesophageal reflux disease)   . Dementia   . CAD (coronary artery disease)   . Cardiomyopathy   . Osteoporosis   . Constipation   . Failure to thrive in childhood     Past Surgical History  Procedure Laterality Date  . Appendectomy      Family History  Problem Relation Age of Onset  . Alzheimer's disease    . Coronary artery disease      History  Substance Use Topics  . Smoking status: Former Smoker    Quit date: 10/25/2002  . Smokeless tobacco: Not on file  . Alcohol Use: Not on file    OB History   Grav Para Term Preterm Abortions TAB SAB Ect Mult Living                  Review of Systems  All systems reviewed and negative, other than as noted in HPI.   Allergies  Tramadol  Home Medications   Current Outpatient Rx  Name  Route  Sig  Dispense  Refill  . acetaminophen (TYLENOL) 325 MG tablet   Oral   Take 325-650 mg by mouth every 6 (six) hours as needed. For pain.         Marland Kitchen aspirin 81 MG chewable tablet   Oral   Chew 81 mg by mouth every morning.         . diclofenac sodium (VOLTAREN) 1 % GEL   Topical   Apply 2 g topically 2 (two) times daily. Apply to right knee         . donepezil (ARICEPT) 5 MG tablet   Oral   Take 5 mg by mouth at bedtime.         . fluconazole (DIFLUCAN) 100 MG tablet  Oral   Take 100 mg by mouth every other day. x7 doses         . furosemide (LASIX) 20 MG tablet   Oral   Take 20-40 mg by mouth 2 (two) times daily. 2 tablets (40mg ) at 0600, 1 tablet (20mg ) at noon         . haloperidol (HALDOL) 1 MG tablet   Oral   Take 1 mg by mouth at bedtime.         Marland Kitchen losartan (COZAAR) 50 MG tablet   Oral   Take 50 mg by mouth every morning.         Marland Kitchen omeprazole (PRILOSEC) 20 MG capsule   Oral   Take 20 mg by mouth every morning.         . raloxifene (EVISTA) 60 MG tablet   Oral   Take 60 mg by mouth every morning.         . sertraline (ZOLOFT) 25 MG tablet   Oral   Take 25 mg by  mouth at bedtime.           There were no vitals taken for this visit.  Physical Exam  Nursing note and vitals reviewed. Constitutional: She appears well-developed and well-nourished. No distress.  HENT:  Head: Normocephalic and atraumatic.  Eyes: Conjunctivae are normal. Right eye exhibits no discharge. Left eye exhibits no discharge.  Neck: Neck supple.  Cardiovascular: Normal rate, regular rhythm and normal heart sounds.  Exam reveals no gallop and no friction rub.   No murmur heard. Pulmonary/Chest: Effort normal and breath sounds normal. No respiratory distress.  Abdominal: Soft. She exhibits no distension. There is no tenderness.  Musculoskeletal: She exhibits no edema and no tenderness.  Wound to dorsal aspect L 4th toe. Purulence. Wound probes to bone. Pitting edema of foot and distal leg. Increased warmth, erythema and tenderness of foot and ankle.   Neurological: She is alert.  Skin: Skin is warm and dry.  Psychiatric: She has a normal mood and affect. Her behavior is normal. Thought content normal.    ED Course  Procedures (including critical care time)  Labs Reviewed  CBC - Abnormal; Notable for the following:    RBC 3.57 (*)    Hemoglobin 11.1 (*)    HCT 33.6 (*)    All other components within normal limits  BASIC METABOLIC PANEL -  Abnormal; Notable for the following:    Sodium 133 (*)    Potassium 3.4 (*)    BUN 32 (*)    Creatinine, Ser 1.56 (*)    GFR calc non Af Amer 28 (*)    GFR calc Af Amer 33 (*)    All other components within normal limits   Dg Toe 4th Left  12/07/2012  *RADIOLOGY REPORT*  Clinical Data: Pain and swelling fourth toe  LEFT FOURTH TOE  Comparison: None.  Findings: Four views of the left fourth toe submitted.  No acute fracture or subluxation.  There is diffuse osteopenia.  There is old fracture deformity distal fifth metatarsal.  No radiopaque foreign body is identified.  No definite bone destruction. Soft tissue swelling fourth toe.  IMPRESSION: No acute fracture or subluxation.  Markedly limited study by diffuse osteopenia.  No definite bone destruction. Soft tissue swelling fourth toe.   Original Report Authenticated By: Natasha Mead, M.D.      1. Osteomyelitis of toe of left foot   2. Cellulitis of left lower leg       MDM  89yf with L foot/ankle swelling and pain. Pt clinically with osteomyelitis of 4th toe with deep tracking of wound with bone palpable with probing. Cellulitis of foot/lower leg likely as extension of this.    2:29 PM Discussed with Dr Alveda Reasons, orthopedic surgery.       Raeford Razor, MD 12/07/12 1440

## 2012-12-07 NOTE — ED Notes (Signed)
Per EMS- Patient is a resident of [facility]. Staff noticed that the patient had redness and swelling of left foot and an ulcerated area. Edema to right ankle also. Staff also reported that the patient has had a history of cellulitis to the right ankle

## 2012-12-07 NOTE — Progress Notes (Signed)
CRITICAL VALUE ALERT  Critical value received: MRSA PCR  Date of notification:  21314  Time of notification:  1820  Critical value read back:yes  Nurse who received alert:  Orlene Och, RN  MD notified (1st page):  Per protocol. MD not paged.  Time of first page:    MD notified (2nd page):  Time of second page:  Responding MD:    Time MD responded:

## 2012-12-07 NOTE — ED Notes (Signed)
Bed:WHALA<BR> Expected date:<BR> Expected time:<BR> Means of arrival:<BR> Comments:<BR> ems

## 2012-12-07 NOTE — H&P (Signed)
Physician Admission History and Physical     PCP:   Katie Pounds, MD   Chief Complaint:  L 4th toe wound   HPI: [name] is an 79 y.o. female.  Pt presents from a facility w/ a L 4th toe wound. Found to be able to be probed to the bone. XRay showed soft tissue swelling but no def bone involvement, but poor study.  Started on abx and ortho c/s'd. Will hydrate w/ IVFs given some AKI likely 2/2 dehydration given low Na, Cl as well.   Review of Systems:  Denies chills, fevers, and only what is stated above are the pertinent positives   Past History Past Medical History (reviewed - no changes required): HTN, CAD/CABG  2005,  Cardiomyopathy/CHF,  Hyperlipidemia,   LBBB,   Breast Cancer-09/23/04,    Broken Wrist 04/2008,    Polyps,     Diverticulosis,   ,  H/O TIA,   Constipation,   OA/Arthralgias,   Osteoporosis,   GERD,   Anemia,  Vit D Insuff,   Early to Mod Dementia with sundowning/Confusion/Delerium,  Seb Cyst S/P I&D,  Adult Failure To Thrive,   UTIs,   BLE edema,  Electrolyte issues,   Relative Anorexia and Malnutrition,  Depression,  High Freq hearing Loss, Bleeding Ulcer 1995 Surgical History (reviewed - no changes required): Breast Cancer-R partial mastectomy 2005 CABG-03/04/04-L phrenic n paresis L hemidiaphraghm R-Cataract L-Cataract 3/09 Family History (reviewed - no changes required): No family history-raised in orphanage. 1 brother died-alzheimers Social History (reviewed - no changes required): Widowed 1971 4 children, 6 grandchildren, 4 great grandchildren Lives on same road as daughter on same property - Now on [employer]. Tobacco use d/ced yrs ago est 1980--1 pk per week    Complete Medication List: 1)  Depend Adjustable Underwear Lg Misc (Incontinence supply disposable) .... Depends pullups (not peal off) 2-3 per day 2)  Furosemide 20 Mg Tabs (Furosemide) .... One q monday. 3)  Benicar 40 Mg Tabs (Olmesartan medoxomil) .... Once daily 4)  Prilosec 20 Mg Cpdr  (Omeprazole) .... Take one daily 5)  Evista 60 Mg Tabs (Raloxifene hcl) .Marland Kitchen.. 1 po daily 6)  Haloperidol 1 Mg Tabs (Haloperidol) .... One p.o. q.h.s. p.r.n. 7)  Aricept 5 Mg Tabs (Donepezil hcl) .... One po hs 8)  Losartan Potassium 50 Mg Tabs (Losartan potassium) .... Take one by mouth once a day 9)  Zoloft 25 Mg Tabs (Sertraline hcl) .... One per day 10)  Miralax Powd (Polyethylene glycol 3350) 11)  Aspirin 81 Mg Chw Tab (Aspirin) .Marland Kitchen.. 1 po qd   Physical Exam: Filed Vitals:   12/07/12 1251  BP: 139/80  Pulse: 84  Temp: 98 F (36.7 C)  TempSrc: Oral  Resp: 17  SpO2: 95%   Gen: poorly nourished WF in NAD  HEENT: PERRL, EOMI, dry MM  Lungs:CTAB, no wheezes/rales  Cardio:RRR no MRG  ZOX:WRUE, nontender, + BS  Ext: small deep lesion on the L 4th tarsal. No clubbing, cyanosis or edema  Neuro: dementia baseline per the family. No focal weakness   Labs on Admission:   Recent Labs  12/07/12 1300  NA 133*  K 3.4*  CL 97  CO2 23  GLUCOSE 92  BUN 32*  CREATININE 1.56*  CALCIUM 8.7   No results found for this basename: AST, ALT, ALKPHOS, BILITOT, PROT, ALBUMIN,  in the last 72 hours No results found for this basename: LIPASE, AMYLASE,  in the last 72 hours  Recent Labs  12/07/12  1300  WBC 8.8  HGB 11.1*  HCT 33.6*  MCV 94.1  PLT 219   No results found for this basename: CKTOTAL, CKMB, CKMBINDEX, TROPONINI,  in the last 72 hours No results found for this basename: INR,  PROTIME   No results found for this basename: TSH, T4TOTAL, FREET3, T3FREE, THYROIDAB,  in the last 72 hours No results found for this basename: VITAMINB12, FOLATE, FERRITIN, TIBC, IRON, RETICCTPCT,  in the last 72 hours  Radiological Exams on Admission: Dg Toe 4th Left  12/07/2012  *RADIOLOGY REPORT*  Clinical Data: Pain and swelling fourth toe  LEFT FOURTH TOE  Comparison: None.  Findings: Four views of the left fourth toe submitted.  No acute fracture or subluxation.  There is diffuse osteopenia.   There is old fracture deformity distal fifth metatarsal.  No radiopaque foreign body is identified.  No definite bone destruction. Soft tissue swelling fourth toe.  IMPRESSION: No acute fracture or subluxation.  Markedly limited study by diffuse osteopenia.  No definite bone destruction. Soft tissue swelling fourth toe.   Original Report Authenticated By: [name], M.D.    No orders found for this or any previous visit.  Assessment/Plan Principal Problem:   Osteomyelitis - location is the L 4th toe. Dx criteria is being able to probe bone on the lesion. Started on vanc and zosyn in Ed and this will be continued per pharmacy c/s. NS at 100cc/hr started.  ED MD Discussed with Dr [name], orthopedic surgery. We appreciate their recs. Otherwise I will continue her home meds and avoid lovenox for DVT ppx at this time until determined if patient requires any type of operation. Cont ASA 81. Cardiac diet. All home meds continued. Tele.   [name] 12/07/2012, 2:22 PM

## 2012-12-08 LAB — BASIC METABOLIC PANEL
BUN: 24 mg/dL — ABNORMAL HIGH (ref 6–23)
CO2: 26 mEq/L (ref 19–32)
Chloride: 105 mEq/L (ref 96–112)
Creatinine, Ser: 1.6 mg/dL — ABNORMAL HIGH (ref 0.50–1.10)
Glucose, Bld: 100 mg/dL — ABNORMAL HIGH (ref 70–99)

## 2012-12-08 LAB — CBC
HCT: 31.1 % — ABNORMAL LOW (ref 36.0–46.0)
MCH: 30.9 pg (ref 26.0–34.0)
MCHC: 32.2 g/dL (ref 30.0–36.0)
MCV: 96 fL (ref 78.0–100.0)
RDW: 14.1 % (ref 11.5–15.5)

## 2012-12-08 MED ORDER — POTASSIUM CHLORIDE CRYS ER 20 MEQ PO TBCR
20.0000 meq | EXTENDED_RELEASE_TABLET | Freq: Every day | ORAL | Status: DC
  Administered 2012-12-08 – 2012-12-09 (×2): 20 meq via ORAL
  Filled 2012-12-08 (×3): qty 1

## 2012-12-08 MED ORDER — PIPERACILLIN-TAZOBACTAM IN DEX 2-0.25 GM/50ML IV SOLN
2.2500 g | Freq: Four times a day (QID) | INTRAVENOUS | Status: DC
  Administered 2012-12-08 – 2012-12-09 (×5): 2.25 g via INTRAVENOUS
  Filled 2012-12-08 (×9): qty 50

## 2012-12-08 MED ORDER — ENSURE COMPLETE PO LIQD
237.0000 mL | Freq: Two times a day (BID) | ORAL | Status: DC
  Administered 2012-12-08 – 2012-12-10 (×4): 237 mL via ORAL

## 2012-12-08 MED ORDER — VANCOMYCIN HCL 500 MG IV SOLR
500.0000 mg | INTRAVENOUS | Status: DC
  Administered 2012-12-09 – 2012-12-10 (×2): 500 mg via INTRAVENOUS
  Filled 2012-12-08 (×3): qty 500

## 2012-12-08 NOTE — Consult Note (Signed)
Seen this am with her daughter and daughter who is a Engineer, civil (consulting).  She has erythema/cellulitis involving her left forefoot and an open sore over the PIP joint of the left 4th toe.  The x-rays are negative.  I think this is primarily a cellulitis and may not really be a n osteomyelitis.  Certainly if toe amputation is necessary, it should not bee done until the cueelulitis is under better control.  I have explained all this to the family and that she will not be followed subsequently by me as I will be out of town.  Dr. Dion Saucier will probably be seeing her.

## 2012-12-08 NOTE — Progress Notes (Addendum)
Scab came off wound on left toe and wound open and draining purulent drainage. Obtained culture ( will check with MD about it if want  to order) . Cleanse area with normal saline and applied wet to dry with Normal saline over area.

## 2012-12-08 NOTE — Progress Notes (Signed)
ANTIBIOTIC CONSULT NOTE - INITIAL  Pharmacy Consult for Vancomycin and Zosyn Indication: Osteomyelitis vs cellulitis   Allergies  Allergen Reactions  . Tramadol    Patient Measurements: Height: 5\' 4"  (162.6 cm) Weight: 121 lb (54.885 kg) IBW/kg (Calculated) : 54.7  Vital Signs: Temp: 97.6 F (36.4 C) (02/14 0437) Temp src: Oral (02/14 0437) BP: 125/50 mmHg (02/14 0437) Pulse Rate: 83 (02/14 0437) Intake/Output from previous day: 02/13 0701 - 02/14 0700 In: 2028.7 [P.O.:440; I.V.:1488.7; IV Piggyback:100] Out: 975 [Urine:975] Intake/Output from this shift:    Labs:  Recent Labs  12/07/12 1300 12/08/12 0407  WBC 8.8 6.0  HGB 11.1* 10.0*  PLT 219 192  CREATININE 1.56* 1.60*   Estimated Creatinine Clearance: 20.2 ml/min (by C-G formula based on Cr of 1.6). No results found for this basename: VANCOTROUGH, Leodis Binet, VANCORANDOM, GENTTROUGH, GENTPEAK, GENTRANDOM, TOBRATROUGH, TOBRAPEAK, TOBRARND, AMIKACINPEAK, AMIKACINTROU, AMIKACIN,  in the last 72 hours   Microbiology: Recent Results (from the past 720 hour(s))  MRSA PCR SCREENING     Status: Abnormal   Collection Time    12/07/12  4:28 PM      Result Value Range Status   MRSA by PCR POSITIVE (*) NEGATIVE Final   Comment:            The GeneXpert MRSA Assay (FDA     approved for NASAL specimens     only), is one component of a     comprehensive MRSA colonization     surveillance program. It is not     intended to diagnose MRSA     infection nor to guide or     monitor treatment for     MRSA infections.     RESULT CALLED TO, READ BACK BY AND VERIFIED WITH:     THOMAS,C. AT 1822 ON [id number] BY LOVE,T.    Medical History: Past Medical History  Diagnosis Date  . Depression   . GERD (gastroesophageal reflux disease)   . Dementia   . CAD (coronary artery disease)   . Cardiomyopathy   . Osteoporosis   . Constipation   . Failure to thrive in childhood    Medications:  Anti-infectives   Start     Dose/Rate  Route Frequency Ordered Stop   12/09/12 1000  vancomycin (VANCOCIN) 500 mg in sodium chloride 0.9 % 100 mL IVPB     500 mg 100 mL/hr over 60 Minutes Intravenous Every 24 hours 12/08/12 1112     12/08/12 1200  piperacillin-tazobactam (ZOSYN) IVPB 2.25 g     2.25 g 100 mL/hr over 30 Minutes Intravenous 4 times per day 12/08/12 1111     12/08/12 1000  vancomycin (VANCOCIN) 750 mg in sodium chloride 0.9 % 150 mL IVPB  Status:  Discontinued     750 mg 150 mL/hr over 60 Minutes Intravenous Every 24 hours 12/07/12 1611 12/08/12 1112   12/07/12 2200  piperacillin-tazobactam (ZOSYN) IVPB 3.375 g  Status:  Discontinued     3.375 g 12.5 mL/hr over 240 Minutes Intravenous 3 times per day 12/07/12 1611 12/08/12 1111   12/07/12 1300  vancomycin (VANCOCIN) IVPB 1000 mg/200 mL premix     1,000 mg 200 mL/hr over 60 Minutes Intravenous  Once 12/07/12 1246 12/07/12 1505   12/07/12 1300  piperacillin-tazobactam (ZOSYN) IVPB 3.375 g     3.375 g 100 mL/hr over 30 Minutes Intravenous  Once 12/07/12 1246 12/07/12 1434     Assessment:  79yo F admitted from NH with L 4th toe wound with  cellulitis vs. Osteomyelitis on D#2 empiric antibiotics with vancomycin and zosyn  Will reduce Zosyn and vancomycin doses based on CrCl 20 ml/min with slight bump in Scr.   Will monitor renal function   AF, WBC WNL, No micro pending   Goal of Therapy:  Vancomycin trough level 15-20 mcg/ml  Plan:  Reduce Vancomycin to 500 mg IV q24h Reduce Zosyn to 2.25 grams IV q6h Measure Vanc trough at steady state. Follow up renal fxn and culture results.  Lavenia Stumpo, Loma Messing PharmD Pager #: [phone] 11:22 AM 12/08/2012 ]

## 2012-12-08 NOTE — Progress Notes (Signed)
Paged MD on call for Dr Timothy Lasso about wound culture ( if needed) [facility] stated we did not need one.

## 2012-12-08 NOTE — Progress Notes (Addendum)
Subjective: Admitted with Toe ulcer = Osteomyelitis - on IV Abx Scab came off wound on left toe this am and wound open and draining purulent drainage. Area cleansed with normal saline and applied wet to dry with Normal saline over area.  Seen by Ortho this am and will be followed by Dr Osie Bond over the weekend. No C/O - resting comfortable in bed.  Objective: Vital signs in last 24 hours: Temp:  [97.6 F (36.4 C)-99.7 F (37.6 C)] 97.6 F (36.4 C) (02/14 0437) Pulse Rate:  [72-91] 83 (02/14 0437) Resp:  [16-18] 18 (02/14 0437) BP: (111-142)/(50-62) 125/50 mmHg (02/14 0437) SpO2:  [92 %-94 %] 94 % (02/14 0437) Weight:  [54.885 kg (121 lb)] 54.885 kg (121 lb) (02/13 1541) Weight change:  Last BM Date: 12/06/12  CBG (last 3)  No results found for this basename: GLUCAP,  in the last 72 hours  Intake/Output from previous day:  Intake/Output Summary (Last 24 hours) at 12/08/12 1336 Last data filed at 12/08/12 0612  Gross per 24 hour  Intake 2028.67 ml  Output    975 ml  Net 1053.67 ml   02/13 0701 - 02/14 0700 In: 2028.7 [P.O.:440; I.V.:1488.7; IV Piggyback:100] Out: 975 [Urine:975]   Physical Exam  General appearance: elderly lady in no distress. Eyes: no scleral icterus Throat: oropharynx moist without erythema Resp: CTA Cardio: Reg, m GI: soft, non-tender; bowel sounds normal; no masses,  no organomegaly Extremities: L foot cellulitis and L 4th toe ulcer with foul smelling purulent drainage.   Lab Results:  Recent Labs  12/07/12 1300 12/08/12 0407  NA 133* 139  K 3.4* 3.7  CL 97 105  CO2 23 26  GLUCOSE 92 100*  BUN 32* 24*  CREATININE 1.56* 1.60*  CALCIUM 8.7 8.2*    No results found for this basename: AST, ALT, ALKPHOS, BILITOT, PROT, ALBUMIN,  in the last 72 hours   Recent Labs  12/07/12 1300 12/08/12 0407  WBC 8.8 6.0  HGB 11.1* 10.0*  HCT 33.6* 31.1*  MCV 94.1 96.0  PLT 219 192    No results found for this basename: INR, PROTIME    No  results found for this basename: CKTOTAL, CKMB, CKMBINDEX, TROPONINI,  in the last 72 hours  No results found for this basename: TSH, T4TOTAL, FREET3, T3FREE, THYROIDAB,  in the last 72 hours  No results found for this basename: VITAMINB12, FOLATE, FERRITIN, TIBC, IRON, RETICCTPCT,  in the last 72 hours  Micro Results: Recent Results (from the past 240 hour(s))  MRSA PCR SCREENING     Status: Abnormal   Collection Time    12/07/12  4:28 PM      Result Value Range Status   MRSA by PCR POSITIVE (*) NEGATIVE Final   Comment:            The GeneXpert MRSA Assay (FDA     approved for NASAL specimens     only), is one component of a     comprehensive MRSA colonization     surveillance program. It is not     intended to diagnose MRSA     infection nor to guide or     monitor treatment for     MRSA infections.     RESULT CALLED TO, READ BACK BY AND VERIFIED WITH:     THOMAS,C. AT 1822 ON [id number] BY LOVE,T.     Studies/Results: Dg Toe 4th Left  12/07/2012  *RADIOLOGY REPORT*  Clinical Data: Pain and swelling fourth toe  LEFT FOURTH TOE  Comparison: None.  Findings: Four views of the left fourth toe submitted.  No acute fracture or subluxation.  There is diffuse osteopenia.  There is old fracture deformity distal fifth metatarsal.  No radiopaque foreign body is identified.  No definite bone destruction. Soft tissue swelling fourth toe.  IMPRESSION: No acute fracture or subluxation.  Markedly limited study by diffuse osteopenia.  No definite bone destruction. Soft tissue swelling fourth toe.   Original Report Authenticated By: Natasha Mead, M.D.      Medications: Scheduled: . aspirin EC  81 mg Oral Daily  . Chlorhexidine Gluconate Cloth  6 each Topical Q0600  . docusate sodium  100 mg Oral BID  . donepezil  5 mg Oral QHS  . furosemide  20 mg Oral Q24H  . furosemide  40 mg Oral Q24H  . haloperidol  1 mg Oral QHS  . losartan  50 mg Oral q morning - 10a  . mupirocin ointment  1 application  Nasal BID  . pantoprazole  40 mg Oral Daily  . piperacillin-tazobactam (ZOSYN)  IV  2.25 g Intravenous Q6H  . raloxifene  60 mg Oral q morning - 10a  . senna  1 tablet Oral BID  . sertraline  25 mg Oral QHS  . sodium chloride  3 mL Intravenous Q12H  . [START ON 12/09/2012] vancomycin  500 mg Intravenous Q24H   Continuous: . sodium chloride 100 mL/hr at 12/08/12 4540     Assessment/Plan: Principal Problem:   Osteomyelitis  Cellulitis c presumed Osteo - x ray (-) and already seen by Otho who does not feel amputation is necessary at this point.  IV Abx will be given and followed by me and Dr Osie Bond over the weekend and decide on further plan.  Wound Care consult and Dressing changes on order.  If needs Amputation, hopefully will just be the toe.  Electrolyte Issues. - Replete K  HTN - BP fine  Anemia - Hbg OK - Follow.  Dementia - On Meds.  AKI Cr stable to a little better  CAD/CABG - No Angina  ID -  Anti-infectives   Start     Dose/Rate Route Frequency Ordered Stop   12/09/12 1000  vancomycin (VANCOCIN) 500 mg in sodium chloride 0.9 % 100 mL IVPB     500 mg 100 mL/hr over 60 Minutes Intravenous Every 24 hours 12/08/12 1112     12/08/12 1200  piperacillin-tazobactam (ZOSYN) IVPB 2.25 g     2.25 g 100 mL/hr over 30 Minutes Intravenous 4 times per day 12/08/12 1111     12/08/12 1000  vancomycin (VANCOCIN) 750 mg in sodium chloride 0.9 % 150 mL IVPB  Status:  Discontinued     750 mg 150 mL/hr over 60 Minutes Intravenous Every 24 hours 12/07/12 1611 12/08/12 1112   12/07/12 2200  piperacillin-tazobactam (ZOSYN) IVPB 3.375 g  Status:  Discontinued     3.375 g 12.5 mL/hr over 240 Minutes Intravenous 3 times per day 12/07/12 1611 12/08/12 1111   12/07/12 1300  vancomycin (VANCOCIN) IVPB 1000 mg/200 mL premix     1,000 mg 200 mL/hr over 60 Minutes Intravenous  Once 12/07/12 1246 12/07/12 1505   12/07/12 1300  piperacillin-tazobactam (ZOSYN) IVPB 3.375 g     3.375 g 100 mL/hr  over 30 Minutes Intravenous  Once 12/07/12 1246 12/07/12 1434     DVT Prophylaxis    LOS: 1 day   Aniella Wandrey M 12/08/2012, 1:36 PM

## 2012-12-08 NOTE — Consult Note (Signed)
Reason for Consult: Ulcer left foot fourth toe with cellulitis Referring Physician: Dr. Tobias Schwartz is an 79 y.o. female.  HPI: Patient presents with a chronic ulcer over the left foot fourth toe PIP joint.  Past Medical History  Diagnosis Date  . Depression   . GERD (gastroesophageal reflux disease)   . Dementia   . CAD (coronary artery disease)   . Cardiomyopathy   . Osteoporosis   . Constipation   . Failure to thrive in childhood     Past Surgical History  Procedure Laterality Date  . Appendectomy      Family History  Problem Relation Age of Onset  . Alzheimer's disease    . Coronary artery disease      Social History:  reports that she quit smoking about 10 years ago. She has never used smokeless tobacco. She reports that she does not drink alcohol or use illicit drugs.  Allergies:  Allergies  Allergen Reactions  . Tramadol     Medications: I have reviewed the patient's current medications.  Results for orders placed during the hospital encounter of 12/07/12 (from the past 48 hour(s))  CBC     Status: Abnormal   Collection Time    12/07/12  1:00 PM      Result Value Range   WBC 8.8  4.0 - 10.5 K/uL   RBC 3.57 (*) 3.87 - 5.11 MIL/uL   Hemoglobin 11.1 (*) 12.0 - 15.0 g/dL   HCT 16.1 (*) 09.6 - 04.5 %   MCV 94.1  78.0 - 100.0 fL   MCH 31.1  26.0 - 34.0 pg   MCHC 33.0  30.0 - 36.0 g/dL   RDW 40.9  81.1 - 91.4 %   Platelets 219  150 - 400 K/uL  BASIC METABOLIC PANEL     Status: Abnormal   Collection Time    12/07/12  1:00 PM      Result Value Range   Sodium 133 (*) 135 - 145 mEq/L   Potassium 3.4 (*) 3.5 - 5.1 mEq/L   Chloride 97  96 - 112 mEq/L   CO2 23  19 - 32 mEq/L   Glucose, Bld 92  70 - 99 mg/dL   BUN 32 (*) 6 - 23 mg/dL   Creatinine, Ser 7.82 (*) 0.50 - 1.10 mg/dL   Calcium 8.7  8.4 - 95.6 mg/dL   GFR calc non Af Amer 28 (*) >90 mL/min   GFR calc Af Amer 33 (*) >90 mL/min   Comment:            The eGFR has been calculated     using  the CKD EPI equation.     This calculation has not been     validated in all clinical     situations.     eGFR's persistently     <90 mL/min signify     possible Chronic Kidney Disease.  MRSA PCR SCREENING     Status: Abnormal   Collection Time    12/07/12  4:28 PM      Result Value Range   MRSA by PCR POSITIVE (*) NEGATIVE   Comment:            The GeneXpert MRSA Assay (FDA     approved for NASAL specimens     only), is one component of a     comprehensive MRSA colonization     surveillance program. It is not     intended to diagnose MRSA  infection nor to guide or     monitor treatment for     MRSA infections.     RESULT CALLED TO, READ BACK BY AND VERIFIED WITH:     [name]. AT 1822 ON [id number] BY LOVE,T.  BASIC METABOLIC PANEL     Status: Abnormal   Collection Time    12/08/12  4:07 AM      Result Value Range   Sodium 139  135 - 145 mEq/L   Potassium 3.7  3.5 - 5.1 mEq/L   Chloride 105  96 - 112 mEq/L   Comment: RESULT REPEATED AND VERIFIED     DELTA CHECK NOTED   CO2 26  19 - 32 mEq/L   Glucose, Bld 100 (*) 70 - 99 mg/dL   BUN 24 (*) 6 - 23 mg/dL   Creatinine, Ser 2.95 (*) 0.50 - 1.10 mg/dL   Calcium 8.2 (*) 8.4 - 10.5 mg/dL   GFR calc non Af Amer 27 (*) >90 mL/min   GFR calc Af Amer 32 (*) >90 mL/min   Comment:            The eGFR has been calculated     using the CKD EPI equation.     This calculation has not been     validated in all clinical     situations.     eGFR's persistently     <90 mL/min signify     possible Chronic Kidney Disease.  CBC     Status: Abnormal   Collection Time    12/08/12  4:07 AM      Result Value Range   WBC 6.0  4.0 - 10.5 K/uL   RBC 3.24 (*) 3.87 - 5.11 MIL/uL   Hemoglobin 10.0 (*) 12.0 - 15.0 g/dL   HCT 28.4 (*) 13.2 - 44.0 %   MCV 96.0  78.0 - 100.0 fL   MCH 30.9  26.0 - 34.0 pg   MCHC 32.2  30.0 - 36.0 g/dL   RDW 10.2  72.5 - 36.6 %   Platelets 192  150 - 400 K/uL    Dg Toe 4th Left  12/07/2012  *RADIOLOGY  REPORT*  Clinical Data: Pain and swelling fourth toe  LEFT FOURTH TOE  Comparison: None.  Findings: Four views of the left fourth toe submitted.  No acute fracture or subluxation.  There is diffuse osteopenia.  There is old fracture deformity distal fifth metatarsal.  No radiopaque foreign body is identified.  No definite bone destruction. Soft tissue swelling fourth toe.  IMPRESSION: No acute fracture or subluxation.  Markedly limited study by diffuse osteopenia.  No definite bone destruction. Soft tissue swelling fourth toe.   Original Report Authenticated By: [name], M.D.     Review of Systems  All other systems reviewed and are negative.   Blood pressure 110/42, pulse 67, temperature 97.7 F (36.5 C), temperature source Oral, resp. rate 16, height 5\' 4"  (1.626 m), weight 54.885 kg (121 lb), SpO2 96.00%. Physical Exam On examination patient does have cellulitis of the left foot. She has a good dorsalis pedis and posterior tibial pulse. There is an ulcer of the left foot PIP joint dorsal fourth toe. Radiographs showed no evidence of osteomyelitis. Assessment/Plan: Assessment: Ulceration without osteomyelitis of the left foot fourth toe PIP joint.  Plan: Would continue IV antibiotics through the weekend. Patient could be discharged on by mouth doxycycline would continue Bactroban dressing changes daily. I will follow up in the office one week.  [name]  V 12/08/2012, 3:11 PM

## 2012-12-08 NOTE — Progress Notes (Signed)
Clinical Social Work Department BRIEF PSYCHOSOCIAL ASSESSMENT 12/08/2012  Patient:  [name], [name]     Account Number:  [id number]     Admit date:  12/07/2012  Clinical Social Worker:  Jacelyn Grip  Date/Time:  12/08/2012 10:20 AM  Referred by:  Physician  Date Referred:  12/08/2012 Referred for  ALF Placement   Other Referral:   Interview type:  Family Other interview type:    PSYCHOSOCIAL DATA Living Status:  FACILITY Admitted from facility:  [facility] Level of care:  Assisted Living Primary support name:  [facility]/daughter Primary support relationship to patient:  CHILD, ADULT Degree of support available:   adequate    CURRENT CONCERNS Current Concerns  Post-Acute Placement   Other Concerns:    SOCIAL WORK ASSESSMENT / PLAN CSW received notification that pt admitted from [facility].    CSW met with pt, pt daughter, and pt granddaughter at bedside. Pt oriented to person only. Pt daughter confirmed pt is Schwartz resident at [facility] and hopeful that pt will be able to return at discharge. Pt family aware that pt return to ALF will be dependent on outcomes of treatment.    CSW spoke to [facility] who confirmed that pt could return at discharge as long as pt was no longer needing IV antibiotics at d/c because ALF cannot manage IV antibiotics.    CSW will continue to follow and continue to update ALF about pt needs at d/c.    CSW to facilitate pt discharge needs when pt medically stable for discharge.   Assessment/plan status:  Psychosocial Support/Ongoing Assessment of Needs Other assessment/ plan:   discharge planning   Information/referral to community resources:   Referral back to [facility]    PATIENT'S/FAMILY'S RESPONSE TO PLAN OF CARE:  Pt oriented to person only. Pt family supportive and actively involved in pt care. Pt family reports that pt has been Schwartz resident at [facility] since 2010 and pt and pt family  are pleased with the care at facility.   Jacklynn Lewis, MSW, LCSWA  Clinical Social Work [phone]

## 2012-12-08 NOTE — Progress Notes (Signed)
INITIAL NUTRITION ASSESSMENT  DOCUMENTATION CODES Per approved criteria  -Not Applicable   INTERVENTION: - Ensure Complete BID - Assisted daughter with ordering meals for pt within her food preferences - Downgraded diet to dysphagia 3 as pt with false teeth and needs meats chopped up  - Will continue to monitor   NUTRITION DIAGNOSIS: Inadequate oral intake related to picky eater as evidenced by <50% meal intake.   Goal: Pt to consume >75% of meals.   Monitor:  Weights, labs, intake   Reason for Assessment: Consult  79 y.o. female  Admitting Dx: Osteomyelitis  ASSESSMENT: Pt from [facility]. Pt sleeping during visit however daughter present at bedside. She reports pt was on chopped meats diet at facility due to pt with false teeth. She reports pt typically eats well and weighed 96 pounds in 2010, now weighs 121 pounds. She reports pt not on any nutritional supplements. She reports pt's appetite only went down since admission because of pt being a picky eater. She reports even though pt only is able to use 2 of her fingers a thumb from each hand she is able to feed herself and is working with physical therapy at her facility to help with this issue. Pt with osteomyelitis of 4th toe.   Height: Ht Readings from Last 1 Encounters:  12/07/12 5\' 4"  (1.626 m)    Weight: Wt Readings from Last 1 Encounters:  12/07/12 121 lb (54.885 kg)    Ideal Body Weight: 120 lb  % Ideal Body Weight: 100  Wt Readings from Last 10 Encounters:  12/07/12 121 lb (54.885 kg)    Usual Body Weight: Family unsure  BMI:  Body mass index is 20.76 kg/(m^2).  Estimated Nutritional Needs: Kcal: 1400-1650 Protein: 65-80g Fluid: 1.4-1.6L/day  Skin: +3 LLE edema, left 4th toe wound  Diet Order: Dysphagia 3   EDUCATION NEEDS: -No education needs identified at this time   Intake/Output Summary (Last 24 hours) at 12/08/12 1504 Last data filed at 12/08/12 1350  Gross per 24 hour  Intake    2777 ml  Output   1275 ml  Net   1502 ml    Last BM: 2/12  Labs:   Recent Labs Lab 12/07/12 1300 12/08/12 0407  NA 133* 139  K 3.4* 3.7  CL 97 105  CO2 23 26  BUN 32* 24*  CREATININE 1.56* 1.60*  CALCIUM 8.7 8.2*  GLUCOSE 92 100*    CBG (last 3)  No results found for this basename: GLUCAP,  in the last 72 hours  Scheduled Meds: . aspirin EC  81 mg Oral Daily  . Chlorhexidine Gluconate Cloth  6 each Topical Q0600  . docusate sodium  100 mg Oral BID  . donepezil  5 mg Oral QHS  . furosemide  20 mg Oral Q24H  . furosemide  40 mg Oral Q24H  . haloperidol  1 mg Oral QHS  . losartan  50 mg Oral q morning - 10a  . mupirocin ointment  1 application Nasal BID  . pantoprazole  40 mg Oral Daily  . piperacillin-tazobactam (ZOSYN)  IV  2.25 g Intravenous Q6H  . potassium chloride  20 mEq Oral Daily  . raloxifene  60 mg Oral q morning - 10a  . senna  1 tablet Oral BID  . sertraline  25 mg Oral QHS  . sodium chloride  3 mL Intravenous Q12H  . [START ON 12/09/2012] vancomycin  500 mg Intravenous Q24H    Continuous Infusions: . sodium chloride  300 mL (12/08/12 1406)    Past Medical History  Diagnosis Date  . Depression   . GERD (gastroesophageal reflux disease)   . Dementia   . CAD (coronary artery disease)   . Cardiomyopathy   . Osteoporosis   . Constipation   . Failure to thrive in childhood     Past Surgical History  Procedure Laterality Date  . Appendectomy      Levon Hedger MS, RD, LDN [phone] Pager [phone] After Hours Pager

## 2012-12-09 LAB — CBC
Hemoglobin: 10.1 g/dL — ABNORMAL LOW (ref 12.0–15.0)
Platelets: 197 10*3/uL (ref 150–400)
RBC: 3.23 MIL/uL — ABNORMAL LOW (ref 3.87–5.11)
WBC: 7.6 10*3/uL (ref 4.0–10.5)

## 2012-12-09 LAB — BASIC METABOLIC PANEL
CO2: 27 mEq/L (ref 19–32)
Calcium: 8.3 mg/dL — ABNORMAL LOW (ref 8.4–10.5)
GFR calc non Af Amer: 32 mL/min — ABNORMAL LOW (ref >90)
Glucose, Bld: 96 mg/dL (ref 70–99)
Potassium: 3 mEq/L — ABNORMAL LOW (ref 3.5–5.1)
Sodium: 142 mEq/L (ref 135–145)

## 2012-12-09 MED ORDER — PIPERACILLIN-TAZOBACTAM 3.375 G IVPB
3.3750 g | Freq: Three times a day (TID) | INTRAVENOUS | Status: DC
  Administered 2012-12-09 – 2012-12-11 (×5): 3.375 g via INTRAVENOUS
  Filled 2012-12-09 (×6): qty 50

## 2012-12-09 NOTE — Progress Notes (Signed)
Subjective: Admitted with Toe ulcer c possible Osteomyelitis - on IV Abx Purulent drainage. Toe looks better but ahe had fluctuant area on doral surface of MC joints - I put pressure and purulent drainage exploded out of the ulcer. Seen by Ortho and conservative plan in place No C/O - resting comfortable in bed.  Objective: Vital signs in last 24 hours: Temp:  [97.7 F (36.5 C)-98.4 F (36.9 C)] 98.1 F (36.7 C) (02/15 0513) Pulse Rate:  [66-72] 66 (02/15 0513) Resp:  [16] 16 (02/15 0513) BP: (110-143)/(42-60) 124/50 mmHg (02/15 0513) SpO2:  [96 %-98 %] 96 % (02/15 0513) Weight change:  Last BM Date: 12/06/12  CBG (last 3)  No results found for this basename: GLUCAP,  in the last 72 hours  Intake/Output from previous day:  Intake/Output Summary (Last 24 hours) at 12/09/12 0818 Last data filed at 12/09/12 0515  Gross per 24 hour  Intake 798.33 ml  Output   1900 ml  Net -1101.67 ml   02/14 0701 - 02/15 0700 In: 798.3 [I.V.:748.3; IV Piggyback:50] Out: 1900 [Urine:1900]   Physical Exam  General appearance: elderly lady in no distress. Eyes: no scleral icterus Throat: oropharynx moist without erythema Resp: CTA Cardio: Reg, m GI: soft, non-tender; bowel sounds normal; no masses,  no organomegaly Extremities: L foot cellulitis and L 4th toe ulcer with foul smelling purulent drainage - pressure plaved and lots of pus removed.   Lab Results:  Recent Labs  12/08/12 0407 12/09/12 0409  NA 139 142  K 3.7 3.0*  CL 105 106  CO2 26 27  GLUCOSE 100* 96  BUN 24* 16  CREATININE 1.60* 1.40*  CALCIUM 8.2* 8.3*    No results found for this basename: AST, ALT, ALKPHOS, BILITOT, PROT, ALBUMIN,  in the last 72 hours   Recent Labs  12/08/12 0407 12/09/12 0409  WBC 6.0 7.6  HGB 10.0* 10.1*  HCT 31.1* 30.9*  MCV 96.0 95.7  PLT 192 197    No results found for this basename: INR,  PROTIME    No results found for this basename: CKTOTAL, CKMB, CKMBINDEX, TROPONINI,   in the last 72 hours  No results found for this basename: TSH, T4TOTAL, FREET3, T3FREE, THYROIDAB,  in the last 72 hours  No results found for this basename: VITAMINB12, FOLATE, FERRITIN, TIBC, IRON, RETICCTPCT,  in the last 72 hours  Micro Results: Recent Results (from the past 240 hour(s))  MRSA PCR SCREENING     Status: Abnormal   Collection Time    12/07/12  4:28 PM      Result Value Range Status   MRSA by PCR POSITIVE (*) NEGATIVE Final   Comment:            The GeneXpert MRSA Assay (FDA     approved for NASAL specimens     only), is one component of a     comprehensive MRSA colonization     surveillance program. It is not     intended to diagnose MRSA     infection nor to guide or     monitor treatment for     MRSA infections.     RESULT CALLED TO, READ BACK BY AND VERIFIED WITH:     THOMAS,C. AT 1822 ON [id number] BY LOVE,T.     Studies/Results: Dg Toe 4th Left  12/07/2012  *RADIOLOGY REPORT*  Clinical Data: Pain and swelling fourth toe  LEFT FOURTH TOE  Comparison: None.  Findings: Four views of the left fourth  toe submitted.  No acute fracture or subluxation.  There is diffuse osteopenia.  There is old fracture deformity distal fifth metatarsal.  No radiopaque foreign body is identified.  No definite bone destruction. Soft tissue swelling fourth toe.  IMPRESSION: No acute fracture or subluxation.  Markedly limited study by diffuse osteopenia.  No definite bone destruction. Soft tissue swelling fourth toe.   Original Report Authenticated By: Natasha Mead, M.D.      Medications: Scheduled: . aspirin EC  81 mg Oral Daily  . Chlorhexidine Gluconate Cloth  6 each Topical Q0600  . docusate sodium  100 mg Oral BID  . donepezil  5 mg Oral QHS  . feeding supplement  237 mL Oral BID BM  . furosemide  20 mg Oral Q24H  . furosemide  40 mg Oral Q24H  . haloperidol  1 mg Oral QHS  . losartan  50 mg Oral q morning - 10a  . mupirocin ointment  1 application Nasal BID  . pantoprazole   40 mg Oral Daily  . piperacillin-tazobactam (ZOSYN)  IV  2.25 g Intravenous Q6H  . potassium chloride  20 mEq Oral Daily  . raloxifene  60 mg Oral q morning - 10a  . senna  1 tablet Oral BID  . sertraline  25 mg Oral QHS  . sodium chloride  3 mL Intravenous Q12H  . vancomycin  500 mg Intravenous Q24H   Continuous: . sodium chloride 40 mL/hr at 12/08/12 2150     Assessment/Plan: Principal Problem:   Osteomyelitis  Cellulitis c prossible Osteo - x ray (-), and Abscess pocket with sig pus extraction post pressure to dorsum of foot.  Seen by Charlane Ferretti - Dr Alveda Reasons and Dr Lajoyce Corners who do not feel amputation or surgical debridement is necessary at this point.  Per Dr Audrie Lia plan:  Would continue IV antibiotics through the weekend. Patient could be discharged on by mouth doxycycline would continue Bactroban dressing changes daily. He will follow up in the office one week.  Wound Care consulted and Dressing changes on order.  If needs Amputation, hopefully will just be the toe.  Electrolyte Issues. - Replete K/Kdur on order  HTN - BP fine  Anemia - Hbg OK - Follow.  Dementia - On Meds.  AKI Cr stable to a little better and at 1.4 NTD.  Watch fluids.  CAD/CABG - No Angina  ID -  Anti-infectives   Start     Dose/Rate Route Frequency Ordered Stop   12/09/12 1000  vancomycin (VANCOCIN) 500 mg in sodium chloride 0.9 % 100 mL IVPB     500 mg 100 mL/hr over 60 Minutes Intravenous Every 24 hours 12/08/12 1112     12/08/12 1200  piperacillin-tazobactam (ZOSYN) IVPB 2.25 g     2.25 g 100 mL/hr over 30 Minutes Intravenous 4 times per day 12/08/12 1111     12/08/12 1000  vancomycin (VANCOCIN) 750 mg in sodium chloride 0.9 % 150 mL IVPB  Status:  Discontinued     750 mg 150 mL/hr over 60 Minutes Intravenous Every 24 hours 12/07/12 1611 12/08/12 1112   12/07/12 2200  piperacillin-tazobactam (ZOSYN) IVPB 3.375 g  Status:  Discontinued     3.375 g 12.5 mL/hr over 240 Minutes Intravenous 3 times per day  12/07/12 1611 12/08/12 1111   12/07/12 1300  vancomycin (VANCOCIN) IVPB 1000 mg/200 mL premix     1,000 mg 200 mL/hr over 60 Minutes Intravenous  Once 12/07/12 1246 12/07/12 1505   12/07/12 1300  piperacillin-tazobactam (ZOSYN) IVPB 3.375 g     3.375 g 100 mL/hr over 30 Minutes Intravenous  Once 12/07/12 1246 12/07/12 1434     DVT Prophylaxis    LOS: 2 days   Najia Hurlbutt M 12/09/2012, 8:18 AM

## 2012-12-10 LAB — CBC
HCT: 30.9 % — ABNORMAL LOW (ref 36.0–46.0)
Hemoglobin: 10 g/dL — ABNORMAL LOW (ref 12.0–15.0)
MCH: 31.1 pg (ref 26.0–34.0)
MCV: 96 fL (ref 78.0–100.0)
Platelets: 183 10*3/uL (ref 150–400)
RBC: 3.22 MIL/uL — ABNORMAL LOW (ref 3.87–5.11)
WBC: 5.4 10*3/uL (ref 4.0–10.5)

## 2012-12-10 LAB — BASIC METABOLIC PANEL
CO2: 25 mEq/L (ref 19–32)
Chloride: 106 mEq/L (ref 96–112)
Glucose, Bld: 95 mg/dL (ref 70–99)
Sodium: 140 mEq/L (ref 135–145)

## 2012-12-10 MED ORDER — POTASSIUM CHLORIDE CRYS ER 20 MEQ PO TBCR
40.0000 meq | EXTENDED_RELEASE_TABLET | Freq: Every day | ORAL | Status: DC
  Administered 2012-12-10: 40 meq via ORAL
  Filled 2012-12-10 (×2): qty 2

## 2012-12-10 NOTE — Consult Note (Signed)
WOC consult Note Reason for Consult:MD Timothy Lasso) requested consultation simultaneous to ortho consult; ortho seeing and following.  Plan in place for conservative care (twice daily dressing changes with bacitracin) and for patient to follow up with Dr. Lajoyce Corners in office 1 week after discharge from acute care. I did not see in light of ortho's consultation and subsequent orders/POC. Wound type:infectious vs neuropathic Pressure Ulcer POA: No I will not follow.  Please re-consult if needed. Thanks, Ladona Mow, MSN, RN, [facility], CWOCN [phone])

## 2012-12-10 NOTE — Progress Notes (Signed)
Patient ID: [name], female   DOB: 03/28/1920, 79 y.o.   MRN: [id number] PATIENT ID: [name]        MRN:  [id number]          DOB/AGE: 10-03-1920 / 79 y.o.    Norlene Campbell, MD   Jacqualine Code, PA-C [address] [place], [place]  [address]                             [phone]   PROGRESS NOTE  Subjective:  negative for Chest Pain  negative for Shortness of Breath  negative for Nausea/Vomiting   negative for Calf Pain    Tolerating Diet: yes         Patient reports pain as mild.       Objective: Vital signs in last 24 hours:   Patient Vitals for the past 24 hrs:  BP Temp Temp src Pulse Resp SpO2  12/10/12 0516 143/56 mmHg 98.2 F (36.8 C) Axillary 67 16 96 %  12/09/12 2026 130/50 mmHg 98.9 F (37.2 C) Axillary 75 24 96 %      Intake/Output from previous day:   02/15 0701 - 02/16 0700 In: 2815 [P.O.:720; I.V.:1634] Out: 1350 [Urine:1350]   Intake/Output this shift:   02/16 0701 - 02/16 1900 In: 120 [P.O.:120] Out: 225 [Urine:225]   Intake/Output     02/15 0701 - 02/16 0700 02/16 0701 - 02/17 0700   P.O. 720 120   I.V. (mL/kg) 1634 (29.8)    IV Piggyback 461    Total Intake(mL/kg) 2815 (51.3) 120 (2.2)   Urine (mL/kg/hr) 1350 (1) 225 (0.7)   Total Output 1350 225   Net +1465 -105        Stool Occurrence 1 x       LABORATORY DATA:  Recent Labs  12/07/12 1300 12/08/12 0407 12/09/12 0409 12/10/12 0403  WBC 8.8 6.0 7.6 5.4  HGB 11.1* 10.0* 10.1* 10.0*  HCT 33.6* 31.1* 30.9* 30.9*  PLT 219 192 197 183    Recent Labs  12/07/12 1300 12/08/12 0407 12/09/12 0409 12/10/12 0403  NA 133* 139 142 140  K 3.4* 3.7 3.0* 3.1*  CL 97 105 106 106  CO2 23 26 27 25   BUN 32* 24* 16 11  CREATININE 1.56* 1.60* 1.40* 1.21*  GLUCOSE 92 100* 96 95  CALCIUM 8.7 8.2* 8.3* 8.1*   No results found for this basename: INR, PROTIME    Recent Radiographic Studies :  Dg Toe 4th Left  12/07/2012  *RADIOLOGY REPORT*  Clinical Data: Pain and swelling  fourth toe  LEFT FOURTH TOE  Comparison: None.  Findings: Four views of the left fourth toe submitted.  No acute fracture or subluxation.  There is diffuse osteopenia.  There is old fracture deformity distal fifth metatarsal.  No radiopaque foreign body is identified.  No definite bone destruction. Soft tissue swelling fourth toe.  IMPRESSION: No acute fracture or subluxation.  Markedly limited study by diffuse osteopenia.  No definite bone destruction. Soft tissue swelling fourth toe.   Original Report Authenticated By: Natasha Mead, M.D.      Examination:  General appearance: alert, cooperative and no distress  Wound Exam: draining   Drainage:  Scant/small amount Serosanguinous exudate, Purulent exudate  }     Assessment:          ADDITIONAL DIAGNOSIS:  Principal Problem:   Osteomyelitis     Plan: Wound examined with  minimal drainage from sinus at level of 4th MTP joint-apparently much less than yesterday. Family relates foot with much less swelling, erythema-stable   Continue with present RX-see Dr Lajoyce Corners this week in office.         Norlene Campbell W 12/10/2012 1:00 PM

## 2012-12-10 NOTE — Progress Notes (Signed)
Subjective: Admitted with Toe ulcer c possible Osteomyelitis - on IV Abx Toe looks better everyday.  She had a fluctuant area on doral surface of MC joints - I put pressure and purulent drainage exploded out of the ulcer yesterday - today a little more pus but mainly dark blood.  Skin fairly macerated. Seen by Ortho and conservative plan in place More awake and comfortable today Objective: Vital signs in last 24 hours: Temp:  [98.2 F (36.8 C)-98.9 F (37.2 C)] 98.2 F (36.8 C) (02/16 0516) Pulse Rate:  [67-75] 67 (02/16 0516) Resp:  [16-24] 16 (02/16 0516) BP: (130-143)/(50-56) 143/56 mmHg (02/16 0516) SpO2:  [96 %] 96 % (02/16 0516) Weight change:  Last BM Date: 12/09/12  CBG (last 3)  No results found for this basename: GLUCAP,  in the last 72 hours  Intake/Output from previous day:  Intake/Output Summary (Last 24 hours) at 12/10/12 0928 Last data filed at 12/10/12 0539  Gross per 24 hour  Intake   2815 ml  Output   1350 ml  Net   1465 ml   02/15 0701 - 02/16 0700 In: 2815 [P.O.:720; I.V.:1634; IV Piggyback:461] Out: 1350 [Urine:1350]   Physical Exam  General appearance: elderly lady in no distress. Eyes: no scleral icterus Throat: oropharynx moist without erythema Resp: CTA Cardio: Reg, m GI: soft, non-tender; bowel sounds normal; no masses,  no organomegaly Extremities: L foot cellulitis and L 4th toe ulcer improving.  MTP blood blister with macerated skin and mild amounts pus today  Lab Results:  Recent Labs  12/09/12 0409 12/10/12 0403  NA 142 140  K 3.0* 3.1*  CL 106 106  CO2 27 25  GLUCOSE 96 95  BUN 16 11  CREATININE 1.40* 1.21*  CALCIUM 8.3* 8.1*    No results found for this basename: AST, ALT, ALKPHOS, BILITOT, PROT, ALBUMIN,  in the last 72 hours   Recent Labs  12/09/12 0409 12/10/12 0403  WBC 7.6 5.4  HGB 10.1* 10.0*  HCT 30.9* 30.9*  MCV 95.7 96.0  PLT 197 183    No results found for this basename: INR,  PROTIME    No  results found for this basename: CKTOTAL, CKMB, CKMBINDEX, TROPONINI,  in the last 72 hours  No results found for this basename: TSH, T4TOTAL, FREET3, T3FREE, THYROIDAB,  in the last 72 hours  No results found for this basename: VITAMINB12, FOLATE, FERRITIN, TIBC, IRON, RETICCTPCT,  in the last 72 hours  Micro Results: Recent Results (from the past 240 hour(s))  MRSA PCR SCREENING     Status: Abnormal   Collection Time    12/07/12  4:28 PM      Result Value Range Status   MRSA by PCR POSITIVE (*) NEGATIVE Final   Comment:            The GeneXpert MRSA Assay (FDA     approved for NASAL specimens     only), is one component of a     comprehensive MRSA colonization     surveillance program. It is not     intended to diagnose MRSA     infection nor to guide or     monitor treatment for     MRSA infections.     RESULT CALLED TO, READ BACK BY AND VERIFIED WITH:     THOMAS,C. AT 1822 ON [id number] BY LOVE,T.     Studies/Results: No results found.   Medications: Scheduled: . aspirin EC  81 mg Oral Daily  .  Chlorhexidine Gluconate Cloth  6 each Topical Q0600  . docusate sodium  100 mg Oral BID  . donepezil  5 mg Oral QHS  . feeding supplement  237 mL Oral BID BM  . furosemide  20 mg Oral Q24H  . furosemide  40 mg Oral Q24H  . haloperidol  1 mg Oral QHS  . losartan  50 mg Oral q morning - 10a  . mupirocin ointment  1 application Nasal BID  . pantoprazole  40 mg Oral Daily  . piperacillin-tazobactam (ZOSYN)  IV  3.375 g Intravenous Q8H  . potassium chloride  20 mEq Oral Daily  . raloxifene  60 mg Oral q morning - 10a  . senna  1 tablet Oral BID  . sertraline  25 mg Oral QHS  . sodium chloride  3 mL Intravenous Q12H  . vancomycin  500 mg Intravenous Q24H   Continuous: . sodium chloride 40 mL/hr at 12/09/12 2228     Assessment/Plan: Principal Problem:   Osteomyelitis  Cellulitis c prossible Osteo - x ray (-), and Abscess pocket with sig pus extraction yesterday post  pressure to dorsum of foot.  A little more today.  Overall foot looks some better and I will have ortho see again today with any final recs. Seen by Dr Alveda Reasons and Dr Lajoyce Corners who do not feel amputation or surgical debridement is necessary at this point.  Per Dr Audrie Lia plan:  Would continue IV antibiotics through the weekend. Patient could be discharged on by mouth doxycycline. Would continue Bactroban dressing changes daily. He will follow up in the office one week.  Wound Care consulted and Dressing changes on order.  If needs Amputation, hopefully will just be the toe.  Electrolyte Issues. - Replete K again/Kdur on order  HTN - BP fine  Anemia - Hbg OK and stable - Follow.  Dementia - On Meds.  AKI Cr Now at baseline =  1.2 NTD.  Watch fluids.  CAD/CABG - No Angina  FEN - Encouraged to eat.  Dispo - Probably Tuesday back to [facility].  Consider start PT tom.  ID -  Anti-infectives   Start     Dose/Rate Route Frequency Ordered Stop   12/09/12 2200  piperacillin-tazobactam (ZOSYN) IVPB 3.375 g     3.375 g 12.5 mL/hr over 240 Minutes Intravenous 3 times per day 12/09/12 1505     12/09/12 1000  vancomycin (VANCOCIN) 500 mg in sodium chloride 0.9 % 100 mL IVPB     500 mg 100 mL/hr over 60 Minutes Intravenous Every 24 hours 12/08/12 1112     12/08/12 1200  piperacillin-tazobactam (ZOSYN) IVPB 2.25 g  Status:  Discontinued     2.25 g 100 mL/hr over 30 Minutes Intravenous 4 times per day 12/08/12 1111 12/09/12 1504   12/08/12 1000  vancomycin (VANCOCIN) 750 mg in sodium chloride 0.9 % 150 mL IVPB  Status:  Discontinued     750 mg 150 mL/hr over 60 Minutes Intravenous Every 24 hours 12/07/12 1611 12/08/12 1112   12/07/12 2200  piperacillin-tazobactam (ZOSYN) IVPB 3.375 g  Status:  Discontinued     3.375 g 12.5 mL/hr over 240 Minutes Intravenous 3 times per day 12/07/12 1611 12/08/12 1111   12/07/12 1300  vancomycin (VANCOCIN) IVPB 1000 mg/200 mL premix     1,000 mg 200 mL/hr over 60  Minutes Intravenous  Once 12/07/12 1246 12/07/12 1505   12/07/12 1300  piperacillin-tazobactam (ZOSYN) IVPB 3.375 g     3.375 g 100 mL/hr  over 30 Minutes Intravenous  Once 12/07/12 1246 12/07/12 1434     DVT Prophylaxis    LOS: 3 days   Gregery Walberg M 12/10/2012, 9:28 AM

## 2012-12-11 LAB — CBC
HCT: 32.6 % — ABNORMAL LOW (ref 36.0–46.0)
MCH: 31.5 pg (ref 26.0–34.0)
MCV: 95.9 fL (ref 78.0–100.0)
RDW: 14.2 % (ref 11.5–15.5)
WBC: 4.8 10*3/uL (ref 4.0–10.5)

## 2012-12-11 LAB — BASIC METABOLIC PANEL
BUN: 10 mg/dL (ref 6–23)
CO2: 25 mEq/L (ref 19–32)
Chloride: 104 mEq/L (ref 96–112)
Creatinine, Ser: 1.24 mg/dL — ABNORMAL HIGH (ref 0.50–1.10)
Glucose, Bld: 92 mg/dL (ref 70–99)

## 2012-12-11 MED ORDER — DOXYCYCLINE HYCLATE 100 MG PO TABS
100.0000 mg | ORAL_TABLET | Freq: Two times a day (BID) | ORAL | Status: DC
  Administered 2012-12-11 – 2012-12-12 (×3): 100 mg via ORAL
  Filled 2012-12-11 (×6): qty 1

## 2012-12-11 MED ORDER — POTASSIUM CHLORIDE CRYS ER 20 MEQ PO TBCR
40.0000 meq | EXTENDED_RELEASE_TABLET | Freq: Two times a day (BID) | ORAL | Status: DC
  Administered 2012-12-11 – 2012-12-12 (×2): 40 meq via ORAL
  Filled 2012-12-11 (×3): qty 2

## 2012-12-11 MED ORDER — FLUCONAZOLE 100 MG PO TABS
100.0000 mg | ORAL_TABLET | Freq: Every day | ORAL | Status: DC
  Administered 2012-12-11 – 2012-12-12 (×2): 100 mg via ORAL
  Filled 2012-12-11 (×2): qty 1

## 2012-12-11 MED ORDER — MUPIROCIN CALCIUM 2 % EX CREA
TOPICAL_CREAM | Freq: Two times a day (BID) | CUTANEOUS | Status: DC
  Administered 2012-12-11 (×2): via TOPICAL
  Filled 2012-12-11: qty 15

## 2012-12-11 MED ORDER — MUPIROCIN CALCIUM 2 % EX CREA
TOPICAL_CREAM | Freq: Two times a day (BID) | CUTANEOUS | Status: DC
  Filled 2012-12-11: qty 15

## 2012-12-11 NOTE — Progress Notes (Signed)
Subjective: Admitted with Toe ulcer c possible Osteomyelitis - on IV Abx Toe looks better everyday.  She had a fluctuant area on doral surface of MC joints - I put pressure and purulent drainage exploded out of the ulcer.  Skin fairly macerated.n  A little more drainage/pus today.  Only pain with pressure.  She is a little more Awake alert and interactive daily. Seen by Ortho and conservative plan in place More awake and comfortable today  Objective: Vital signs in last 24 hours: Temp:  [97.5 F (36.4 C)-98.1 F (36.7 C)] 98.1 F (36.7 C) (02/17 0603) Pulse Rate:  [59-65] 59 (02/17 0603) Resp:  [16] 16 (02/17 0603) BP: (122-138)/(56-62) 122/62 mmHg (02/17 0603) SpO2:  [96 %-97 %] 96 % (02/17 0603) Weight change:  Last BM Date: 12/09/12  CBG (last 3)  No results found for this basename: GLUCAP,  in the last 72 hours  Intake/Output from previous day:  Intake/Output Summary (Last 24 hours) at 12/11/12 0725 Last data filed at 12/11/12 0530  Gross per 24 hour  Intake   1724 ml  Output    625 ml  Net   1099 ml   02/16 0701 - 02/17 0700 In: 1724 [P.O.:580; I.V.:964; IV Piggyback:180] Out: 625 [Urine:625]   Physical Exam  General appearance: elderly lady in no distress. Eyes: no scleral icterus Throat: oropharynx moist without erythema Resp: CTA Cardio: Reg, m GI: soft, non-tender; bowel sounds normal; no masses,  no organomegaly Extremities: L foot cellulitis improved and L 4th toe ulcer better.  MTP blood blister with macerated skin and mild amounts pus today  Lab Results:  Recent Labs  12/10/12 0403 12/11/12 0355  NA 140 139  K 3.1* 3.3*  CL 106 104  CO2 25 25  GLUCOSE 95 92  BUN 11 10  CREATININE 1.21* 1.24*  CALCIUM 8.1* 8.2*    No results found for this basename: AST, ALT, ALKPHOS, BILITOT, PROT, ALBUMIN,  in the last 72 hours   Recent Labs  12/10/12 0403 12/11/12 0355  WBC 5.4 4.8  HGB 10.0* 10.7*  HCT 30.9* 32.6*  MCV 96.0 95.9  PLT 183 185     No results found for this basename: INR,  PROTIME    No results found for this basename: CKTOTAL, CKMB, CKMBINDEX, TROPONINI,  in the last 72 hours  No results found for this basename: TSH, T4TOTAL, FREET3, T3FREE, THYROIDAB,  in the last 72 hours  No results found for this basename: VITAMINB12, FOLATE, FERRITIN, TIBC, IRON, RETICCTPCT,  in the last 72 hours  Micro Results: Recent Results (from the past 240 hour(s))  MRSA PCR SCREENING     Status: Abnormal   Collection Time    12/07/12  4:28 PM      Result Value Range Status   MRSA by PCR POSITIVE (*) NEGATIVE Final   Comment:            The GeneXpert MRSA Assay (FDA     approved for NASAL specimens     only), is one component of a     comprehensive MRSA colonization     surveillance program. It is not     intended to diagnose MRSA     infection nor to guide or     monitor treatment for     MRSA infections.     RESULT CALLED TO, READ BACK BY AND VERIFIED WITH:     THOMAS,C. AT 1822 ON [id number] BY LOVE,T.     Studies/Results: No results  found.   Medications: Scheduled: . aspirin EC  81 mg Oral Daily  . Chlorhexidine Gluconate Cloth  6 each Topical Q0600  . docusate sodium  100 mg Oral BID  . donepezil  5 mg Oral QHS  . feeding supplement  237 mL Oral BID BM  . furosemide  20 mg Oral Q24H  . furosemide  40 mg Oral Q24H  . haloperidol  1 mg Oral QHS  . losartan  50 mg Oral q morning - 10a  . mupirocin ointment  1 application Nasal BID  . pantoprazole  40 mg Oral Daily  . piperacillin-tazobactam (ZOSYN)  IV  3.375 g Intravenous Q8H  . potassium chloride  40 mEq Oral Daily  . raloxifene  60 mg Oral q morning - 10a  . senna  1 tablet Oral BID  . sertraline  25 mg Oral QHS  . sodium chloride  3 mL Intravenous Q12H  . vancomycin  500 mg Intravenous Q24H   Continuous: . sodium chloride 40 mL/hr at 12/10/12 2304     Assessment/Plan: Principal Problem:   Osteomyelitis  Cellulitis c ? Osteo although Ulcer  makedly improved - x ray (-), and Abscess pocket with sig pus extraction. Overall foot looks some better and I had ortho see again yesterday. Amputation or surgical debridement is not necessary at this point.  Per Dr Audrie Lia plan:  Would continue IV antibiotics through the weekend. Patient could be discharged on by mouth doxycycline. Would continue Bactroban dressing changes daily. He will follow up in the office one week.  Wound Care consulted and Dressing changes on order.  If needs Amputation, hopefully will just be the toe.  Will transition to oral Abx and possible D/C tomorrow?  Electrolyte Issues. - Replete K again/Kdur on order  HTN - BP fine  Anemia - Hbg OK and stable - Follow.  Dementia - On Meds.  AKI Cr Now at baseline =  1.2 NTD.  Watch fluids.  CAD/CABG - No Angina  FEN - Encouraged to eat.  Dispo - I will D/c IVF and IV Abx - Start Doxy - feed and get up and move better.  Probably Tuesday/Wed back to [facility].  Start PT.  DNR confirmed yesterday and ordered.  ID -  Anti-infectives   Start     Dose/Rate Route Frequency Ordered Stop   12/09/12 2200  piperacillin-tazobactam (ZOSYN) IVPB 3.375 g     3.375 g 12.5 mL/hr over 240 Minutes Intravenous 3 times per day 12/09/12 1505     12/09/12 1000  vancomycin (VANCOCIN) 500 mg in sodium chloride 0.9 % 100 mL IVPB     500 mg 100 mL/hr over 60 Minutes Intravenous Every 24 hours 12/08/12 1112     12/08/12 1200  piperacillin-tazobactam (ZOSYN) IVPB 2.25 g  Status:  Discontinued     2.25 g 100 mL/hr over 30 Minutes Intravenous 4 times per day 12/08/12 1111 12/09/12 1504   12/08/12 1000  vancomycin (VANCOCIN) 750 mg in sodium chloride 0.9 % 150 mL IVPB  Status:  Discontinued     750 mg 150 mL/hr over 60 Minutes Intravenous Every 24 hours 12/07/12 1611 12/08/12 1112   12/07/12 2200  piperacillin-tazobactam (ZOSYN) IVPB 3.375 g  Status:  Discontinued     3.375 g 12.5 mL/hr over 240 Minutes Intravenous 3 times per day 12/07/12  1611 12/08/12 1111   12/07/12 1300  vancomycin (VANCOCIN) IVPB 1000 mg/200 mL premix     1,000 mg 200 mL/hr over 60 Minutes Intravenous  Once 12/07/12 1246 12/07/12 1505   12/07/12 1300  piperacillin-tazobactam (ZOSYN) IVPB 3.375 g     3.375 g 100 mL/hr over 30 Minutes Intravenous  Once 12/07/12 1246 12/07/12 1434     DVT Prophylaxis    LOS: 4 days   Lance Galas M 12/11/2012, 7:25 AM

## 2012-12-11 NOTE — Progress Notes (Signed)
CSW continuing to follow for pt discharge planning needs.  CSW reviewed chart and noted pt transitioning from IV antibiotics to oral antibiotics today and anticipate possible discharge back to [facility].  CSW spoke with [facility] and confirmed that facility can continue to meet pt needs. Facility will need HH pt orders from MD and [facility] RN orders to assist with teaching staff about wound care. Per facility, facility has in-house home health agency.  CSW discussed with pt and pt daughter at bedside.  CSW to facilitate pt discharge needs back to [facility] when pt medically ready for discharge.  Jacklynn Lewis, MSW, LCSWA  Clinical Social Work [phone]

## 2012-12-11 NOTE — Evaluation (Signed)
Physical Therapy Evaluation Patient Details Name: [name] MRN: [id number] DOB: [birth year] Today's Date: 12/11/2012 Time: 1191-4782 PT Time Calculation (min): 30 min  PT Assessment / Plan / Recommendation Clinical Impression  pt is adm with osteomyelitis Left foot and will benefit from PT to maximize independence for return to [facility]; recommend continued PT at [facility]    PT Assessment  Patient needs continued PT services    Follow Up Recommendations  Home health PT    Does the patient have the potential to tolerate intense rehabilitation      Barriers to Discharge        Equipment Recommendations  None recommended by PT    Recommendations for Other Services     Frequency Min 3X/week    Precautions / Restrictions Precautions Precautions: Fall   Pertinent Vitals/Pain       Mobility  Bed Mobility Bed Mobility: Supine to Sit Supine to Sit: 4: Min assist;3: Mod assist Details for Bed Mobility Assistance: cues for task initiation and completion; assist for trunk and LEs Transfers Transfers: Sit to Stand;Stand to Sit;Stand Pivot Transfers Sit to Stand: 4: Min assist Stand to Sit: 4: Min assist Stand Pivot Transfers: 4: Min assist Details for Transfer Assistance: cues for technique and safety Ambulation/Gait Ambulation/Gait Assistance: 4: Min assist Ambulation Distance (Feet): 25 Feet Assistive device: Rolling walker Ambulation/Gait Assistance Details: cues for Rw position, upward gaze and increased step length Gait Pattern: Step-to pattern;Decreased stride length;Decreased hip/knee flexion - right;Narrow base of support Gait velocity: slow    Exercises     PT Diagnosis: Difficulty walking;Generalized weakness  PT Problem List: Decreased strength;Decreased activity tolerance;Decreased balance;Decreased mobility PT Treatment Interventions: DME instruction;Gait training;Functional mobility training;Therapeutic activities;Therapeutic  exercise;Patient/family education   PT Goals Acute Rehab PT Goals PT Goal Formulation: With patient Time For Goal Achievement: 12/18/12 Potential to Achieve Goals: Good Pt will go Supine/Side to Sit: with supervision PT Goal: Supine/Side to Sit - Progress: Goal set today Pt will go Sit to Stand: with supervision PT Goal: Sit to Stand - Progress: Goal set today Pt will go Stand to Sit: with supervision PT Goal: Stand to Sit - Progress: Goal set today Pt will Ambulate: 51 - 150 feet;with supervision;with rolling walker PT Goal: Ambulate - Progress: Goal set today  Visit Information  Last PT Received On: 12/11/12    Subjective Data  Subjective: Pt and dtr present, agreeable to OOB   Prior Functioning  Home Living Available Help at Discharge: Other (Comment) Type of Home: Assisted living [facility] since 2010) Home Adaptive Equipment: Walker - rolling Prior Function Level of Independence: Independent with assistive device(s) Communication Communication: HOH    Cognition  Cognition Overall Cognitive Status: History of cognitive impairments - at baseline    Extremity/Trunk Assessment Right Upper Extremity Assessment RUE ROM/Strength/Tone: WFL for tasks assessed Left Upper Extremity Assessment LUE ROM/Strength/Tone: WFL for tasks assessed Right Lower Extremity Assessment RLE ROM/Strength/Tone:  (grossly 3 to  3+/5 per functional assessment) Left Lower Extremity Assessment LLE ROM/Strength/Tone Deficits:  (same as above) Trunk Assessment Trunk Assessment: Kyphotic   Balance Static Standing Balance Static Standing - Balance Support: Bilateral upper extremity supported;During functional activity Static Standing - Level of Assistance: 4: Min assist  End of Session PT - End of Session Equipment Utilized During Treatment: Gait belt Activity Tolerance: Patient limited by fatigue Patient left: in chair;with call bell/phone within reach;with family/visitor present  GP      [facility] 12/11/2012, 9:35 AM

## 2012-12-12 LAB — BASIC METABOLIC PANEL
BUN: 9 mg/dL (ref 6–23)
Calcium: 8.7 mg/dL (ref 8.4–10.5)
Chloride: 103 mEq/L (ref 96–112)
Creatinine, Ser: 1.17 mg/dL — ABNORMAL HIGH (ref 0.50–1.10)
GFR calc Af Amer: 46 mL/min — ABNORMAL LOW (ref >90)

## 2012-12-12 LAB — CBC
HCT: 34.4 % — ABNORMAL LOW (ref 36.0–46.0)
MCHC: 32.6 g/dL (ref 30.0–36.0)
MCV: 95.3 fL (ref 78.0–100.0)
RDW: 14 % (ref 11.5–15.5)

## 2012-12-12 MED ORDER — HYDROCODONE-ACETAMINOPHEN 5-325 MG PO TABS: 1.0000 | ORAL_TABLET | Freq: Three times a day (TID) | ORAL | Status: DC | PRN

## 2012-12-12 MED ORDER — DOXYCYCLINE HYCLATE 100 MG PO TABS: 100.0000 mg | ORAL_TABLET | Freq: Two times a day (BID) | ORAL | Status: DC

## 2012-12-12 MED ORDER — ENSURE COMPLETE PO LIQD: 237.0000 mL | Freq: Two times a day (BID) | ORAL | Status: DC

## 2012-12-12 MED ORDER — POTASSIUM CHLORIDE CRYS ER 20 MEQ PO TBCR: 20.0000 meq | EXTENDED_RELEASE_TABLET | Freq: Every day | ORAL | Status: DC

## 2012-12-12 MED ORDER — MUPIROCIN CALCIUM 2 % EX CREA: TOPICAL_CREAM | CUTANEOUS | Status: DC

## 2012-12-12 MED ORDER — POLYETHYLENE GLYCOL 3350 17 G PO PACK: 17.0000 g | PACK | Freq: Every day | ORAL | Status: DC | PRN

## 2012-12-12 MED ORDER — FUROSEMIDE 40 MG PO TABS: 40.0000 mg | ORAL_TABLET | ORAL | Status: DC

## 2012-12-12 NOTE — Discharge Summary (Signed)
Physician Discharge Summary  DISCHARGE SUMMARY   Patient ID: [name] MR#: [id number] DOB/AGE: [birth year] 77 y.o.   Attending Physician:Claudean Leavelle M  Patient's WUJ:WJXBJ,YNWG M, MD  Consults:Treatment Team:  Nadara Mustard, MD**  Admit date: 12/07/2012 Discharge date: 12/12/2012  Discharge Diagnoses:  Principal Problem:   Osteomyelitis   Patient Active Problem List  Diagnosis  . Osteomyelitis   Past Medical History  Diagnosis Date  . Depression   . GERD (gastroesophageal reflux disease)   . Dementia   . CAD (coronary artery disease)   . Cardiomyopathy   . Osteoporosis   . Constipation   . Failure to thrive in childhood     Discharged Condition: good  Discharge Medications:   Medication List    TAKE these medications       acetaminophen 325 MG tablet  Commonly known as:  TYLENOL  Take 325-650 mg by mouth every 6 (six) hours as needed. For pain.     aspirin 81 MG chewable tablet  Chew 81 mg by mouth every morning.     diclofenac sodium 1 % Gel  Commonly known as:  VOLTAREN  Apply 2 g topically 2 (two) times daily. Apply to right knee     donepezil 5 MG tablet  Commonly known as:  ARICEPT  Take 5 mg by mouth at bedtime.     doxycycline 100 MG tablet  Commonly known as:  VIBRA-TABS  Take 1 tablet (100 mg total) by mouth every 12 (twelve) hours.     feeding supplement Liqd  Take 237 mLs by mouth 2 (two) times daily between meals.     fluconazole 100 MG tablet  Commonly known as:  DIFLUCAN  Take 100 mg by mouth every other day. x7 doses     furosemide 40 MG tablet  Commonly known as:  LASIX  Take 1 tablet (40 mg total) by mouth daily.     haloperidol 1 MG tablet  Commonly known as:  HALDOL  Take 1 mg by mouth at bedtime.     HYDROcodone-acetaminophen 5-325 MG per tablet  Commonly known as:  NORCO/VICODIN  Take 1 tablet by mouth every 8 (eight) hours as needed for pain (significant foot pain).     losartan 50 MG tablet  Commonly known  as:  COZAAR  Take 50 mg by mouth every morning.     mupirocin cream 2 %  Commonly known as:  BACTROBAN  Apply topically 1-2 (two) times daily to foot whenever dressing is changed     omeprazole 20 MG capsule  Commonly known as:  PRILOSEC  Take 20 mg by mouth every morning.     polyethylene glycol packet  Commonly known as:  MIRALAX / GLYCOLAX  Take 17 g by mouth daily as needed.     potassium chloride SA 20 MEQ tablet  Commonly known as:  K-DUR,KLOR-CON  Take 1 tablet (20 mEq total) by mouth daily.     raloxifene 60 MG tablet  Commonly known as:  EVISTA  Take 60 mg by mouth every morning.     sertraline 25 MG tablet  Commonly known as:  ZOLOFT  Take 25 mg by mouth at bedtime.        Hospital Procedures: Dg Toe 4th Left  12/07/2012  *RADIOLOGY REPORT*  Clinical Data: Pain and swelling fourth toe  LEFT FOURTH TOE  Comparison: None.  Findings: Four views of the left fourth toe submitted.  No acute fracture or subluxation.  There is diffuse osteopenia.  There is old fracture deformity distal fifth metatarsal.  No radiopaque foreign body is identified.  No definite bone destruction. Soft tissue swelling fourth toe.  IMPRESSION: No acute fracture or subluxation.  Markedly limited study by diffuse osteopenia.  No definite bone destruction. Soft tissue swelling fourth toe.   Original Report Authenticated By: Natasha Mead, M.D.     History of Present Illness:  [name] is a 79 y.o. Female who resides at an AL facility and developed a L 4th toe wound. She presented to med attention and to the ED 12/07/12.  Found to be able to be probed to the bone. XRay showed soft tissue swelling but no def bone involvement, but poor study. Started on abx and ortho c/s'd. Hydrated w/ IVFs given AKI and elevated Cr likely secondary to dehydration given low Na, Cl as well.     Hospital Course: Admitted with Toe ulcer c possible Osteomyelitis - Started on IV Abx and changed to PO Abx on 12/11/12.  She  was taken care of by her PCP with ortho involvement.  She underwent routine dressing changes and kept weight off the wound. The Toe looks better everyday. She had a fluctuant area on doral surface of MT joints for which I have been putting pressure on e and purulent drainage exploded out of the ulcer @ 3 days ago and now serosangangenous drainge comes out with a little pus. Her skin fairly macerated now but intact. Her pain is much better and only noted pain with pressure on the foot. She is a little more Awake alert and interactive daily - much better as her kidney function and infection improved.  Ortho recommended a conservative plan and it was put into place  She has remained afebrile.  Again for her Cellulitis and abscess to L 4th toe and MTP area of foot - we initially thought Osteo but Ortho did not think so and x ray was negative. The toe Ulcer is makedly improved. The Abscess pocket has undergone sig pus extraction. Overall foot looks much better than admission.  Ortho was helpful and thank goodness thought amputation or surgical debridement was not necessary. Per Dr Audrie Lia plan: Would continue IV antibiotics through the weekend. Patient could be discharged on by mouth doxycycline. Would continue Bactroban dressing changes daily. He will follow up in the office one week.  She was transitioned to Oral Doxy yesterday and can complete 7 more days of treatment.  Electrolyte Issues. - Potassium was repleted several times and she will be discharged on oral potassium. Last K was 3.5. HTN - BPs have mostly been fine - there was no need to adjust meds. Anemia - Hbg stable in 10.7-11.2 range.  Dementia - On Meds.  AKI Cr Now at baseline. Cr bumped to 1.6 and now is at baseline @ 1.2 NTD. Watch fluids.  CAD/CABG - No Angina  FEN - Encouraged to eat. Ensure added.  Dispo - D/C to [facility] today with [facility] PT/HH RN, Change Dressing once per day and follow up Dr Lajoyce Corners over next one week and f/up me over 1-2  weeks.  DNR confirmed and OOF DNR signed. Skin/Breast Yeast rash made worse with IV Abx - finish 5 more days of Diflucan 100 mg PO daily  Her foot looks much better today and toe is 90%+ healed.  No more pus extracted and macerated skin even looks better.  She has no C/o and can go back to AL today.    Day of Discharge  Exam BP 154/62  Pulse 74  Temp(Src) 97.5 F (36.4 C) (Oral)  Resp 14  Ht 5\' 4"  (1.626 m)  Wt 54.885 kg (121 lb)  BMI 20.76 kg/m2  SpO2 96%  Physical Exam: General appearance: elderly lady in no distress.  Eyes: no scleral icterus  Throat: oropharynx moist without erythema  Resp: CTA  Cardio: Reg, m  GI: soft, non-tender; bowel sounds normal; no masses, no organomegaly  Extremities: L foot cellulitis improved and L 4th toe ulcer better. MTP blood blister with macerated skin and no pus today       Discharge Labs:  Recent Labs  12/11/12 0355 12/12/12 0355  NA 139 140  K 3.3* 3.5  CL 104 103  CO2 25 27  GLUCOSE 92 92  BUN 10 9  CREATININE 1.24* 1.17*  CALCIUM 8.2* 8.7   No results found for this basename: AST, ALT, ALKPHOS, BILITOT, PROT, ALBUMIN,  in the last 72 hours  Recent Labs  12/11/12 0355 12/12/12 0355  WBC 4.8 4.6  HGB 10.7* 11.2*  HCT 32.6* 34.4*  MCV 95.9 95.3  PLT 185 203   No results found for this basename: CKTOTAL, CKMB, CKMBINDEX, TROPONINI,  in the last 72 hours No results found for this basename: TSH, T4TOTAL, FREET3, T3FREE, THYROIDAB,  in the last 72 hours No results found for this basename: VITAMINB12, FOLATE, FERRITIN, TIBC, IRON, RETICCTPCT,  in the last 72 hours No results found for this basename: INR,  PROTIME       Discharge instructions:  01-Home or Self Care Follow-up Information   Follow up with DUDA,MARCUS V, MD In 1 week.   Contact information:   [address] [place] [address] [phone]       Follow up with Gwen Pounds, MD In 2 weeks.   Contact information:   [address] [facility] [place] [address] [phone]        Disposition: Back to AL - [facility]  Follow-up Appts: Follow-up with Dr. Timothy Lasso at [facility] in 2 weeks.  Call for appointment.  Condition on Discharge: stable  Tests Needing Follow-up: None  Time spent in discharge (includes decision making & examination of pt): 40 min  Signed: Fabiana Dromgoole M 12/12/2012, 7:04 AM

## 2012-12-12 NOTE — Progress Notes (Signed)
Pt for discharge back to [facility].  CSW faxed pt discharge information to facility and confirmed discharge information received and reviewed.  CSW discussed with pt and pt daughter at bedside.  RN aware.  CSW arranged ambulance transportation [facility]) for pt back to [facility].  No further social work needs identified at this time.  CSW signing off.   Jacklynn Lewis, MSW, LCSWA  Clinical Social Work [phone]

## 2013-02-15 ENCOUNTER — Encounter: Payer: Self-pay | Admitting: Cardiology

## 2013-09-11 ENCOUNTER — Emergency Department (HOSPITAL_COMMUNITY)
Admission: EM | Admit: 2013-09-11 | Discharge: 2013-09-11 | Disposition: A | Discharge status: Skilled Nursing Facility | Payer: Medicare Other | Attending: Emergency Medicine | Admitting: Emergency Medicine

## 2013-09-11 ENCOUNTER — Emergency Department (HOSPITAL_COMMUNITY): Payer: Medicare Other

## 2013-09-11 ENCOUNTER — Encounter (HOSPITAL_COMMUNITY): Payer: Self-pay | Admitting: Emergency Medicine

## 2013-09-11 DIAGNOSIS — F0390 Unspecified dementia without behavioral disturbance: Secondary | ICD-10-CM | POA: Insufficient documentation

## 2013-09-11 DIAGNOSIS — Z79899 Other long term (current) drug therapy: Secondary | ICD-10-CM | POA: Insufficient documentation

## 2013-09-11 DIAGNOSIS — W19XXXA Unspecified fall, initial encounter: Secondary | ICD-10-CM | POA: Insufficient documentation

## 2013-09-11 DIAGNOSIS — R5381 Other malaise: Secondary | ICD-10-CM | POA: Insufficient documentation

## 2013-09-11 DIAGNOSIS — F3289 Other specified depressive episodes: Secondary | ICD-10-CM | POA: Insufficient documentation

## 2013-09-11 DIAGNOSIS — Z043 Encounter for examination and observation following other accident: Secondary | ICD-10-CM | POA: Insufficient documentation

## 2013-09-11 DIAGNOSIS — Z87891 Personal history of nicotine dependence: Secondary | ICD-10-CM | POA: Insufficient documentation

## 2013-09-11 DIAGNOSIS — Y9210 Unspecified residential institution as the place of occurrence of the external cause: Secondary | ICD-10-CM | POA: Insufficient documentation

## 2013-09-11 DIAGNOSIS — R509 Fever, unspecified: Secondary | ICD-10-CM | POA: Insufficient documentation

## 2013-09-11 DIAGNOSIS — Z7982 Long term (current) use of aspirin: Secondary | ICD-10-CM | POA: Insufficient documentation

## 2013-09-11 DIAGNOSIS — I2510 Atherosclerotic heart disease of native coronary artery without angina pectoris: Secondary | ICD-10-CM | POA: Insufficient documentation

## 2013-09-11 DIAGNOSIS — K219 Gastro-esophageal reflux disease without esophagitis: Secondary | ICD-10-CM | POA: Insufficient documentation

## 2013-09-11 DIAGNOSIS — F329 Major depressive disorder, single episode, unspecified: Secondary | ICD-10-CM | POA: Insufficient documentation

## 2013-09-11 DIAGNOSIS — Y939 Activity, unspecified: Secondary | ICD-10-CM | POA: Insufficient documentation

## 2013-09-11 DIAGNOSIS — M810 Age-related osteoporosis without current pathological fracture: Secondary | ICD-10-CM | POA: Insufficient documentation

## 2013-09-11 DIAGNOSIS — R531 Weakness: Secondary | ICD-10-CM

## 2013-09-11 LAB — URINALYSIS, ROUTINE W REFLEX MICROSCOPIC
Glucose, UA: NEGATIVE mg/dL
Ketones, ur: NEGATIVE mg/dL
Leukocytes, UA: NEGATIVE
Protein, ur: 30 mg/dL — AB
Urobilinogen, UA: 1 mg/dL (ref 0.0–1.0)

## 2013-09-11 LAB — COMPREHENSIVE METABOLIC PANEL
Albumin: 3.6 g/dL (ref 3.5–5.2)
Alkaline Phosphatase: 88 U/L (ref 39–117)
BUN: 19 mg/dL (ref 6–23)
Calcium: 8.8 mg/dL (ref 8.4–10.5)
Chloride: 103 mEq/L (ref 96–112)
Creatinine, Ser: 1.39 mg/dL — ABNORMAL HIGH (ref 0.50–1.10)
GFR calc Af Amer: 37 mL/min — ABNORMAL LOW (ref >90)
GFR calc non Af Amer: 32 mL/min — ABNORMAL LOW (ref >90)
Glucose, Bld: 130 mg/dL — ABNORMAL HIGH (ref 70–99)
Potassium: 4.3 mEq/L (ref 3.5–5.1)
Total Bilirubin: 0.8 mg/dL (ref 0.3–1.2)

## 2013-09-11 LAB — CG4 I-STAT (LACTIC ACID): Lactic Acid, Venous: 3.9 mmol/L — ABNORMAL HIGH (ref 0.5–2.2)

## 2013-09-11 LAB — CBC
HCT: 33.2 % — ABNORMAL LOW (ref 36.0–46.0)
Hemoglobin: 11.1 g/dL — ABNORMAL LOW (ref 12.0–15.0)
MCH: 31.8 pg (ref 26.0–34.0)
RBC: 3.49 MIL/uL — ABNORMAL LOW (ref 3.87–5.11)
WBC: 9.6 10*3/uL (ref 4.0–10.5)

## 2013-09-11 LAB — URINE MICROSCOPIC-ADD ON

## 2013-09-11 MED ORDER — ACETAMINOPHEN 325 MG PO TABS
650.0000 mg | ORAL_TABLET | Freq: Once | ORAL | Status: AC
  Administered 2013-09-11: 650 mg via ORAL
  Filled 2013-09-11: qty 2

## 2013-09-11 MED ORDER — LEVOFLOXACIN 250 MG PO TABS: 250.0000 mg | ORAL_TABLET | Freq: Every day | ORAL | Status: DC

## 2013-09-11 MED ORDER — SODIUM CHLORIDE 0.9 % IV BOLUS (SEPSIS)
1000.0000 mL | Freq: Once | INTRAVENOUS | Status: AC
  Administered 2013-09-11: 1000 mL via INTRAVENOUS

## 2013-09-11 NOTE — ED Notes (Signed)
Pt waiting for PTAR 

## 2013-09-11 NOTE — ED Notes (Signed)
Pt's daughter Deni [phone]

## 2013-09-11 NOTE — ED Notes (Signed)
To ED via [facility] from [facility], staff report increasing weakness today with a fall, no trauma noted, apparent left foot pain, EMS reports new LBBB, denies CP or SOB, VSS, 20g left forearm, questionable hx of dementia, staff unsure of neuro baseline but report difficulty walking today, NAD

## 2013-09-11 NOTE — ED Provider Notes (Signed)
CSN: [id number]     Arrival date & time 09/11/13  1408 History   First MD Initiated Contact with Patient 09/11/13 1415     Chief Complaint  Patient presents with  . Fall   (Consider location/radiation/quality/duration/timing/severity/associated sxs/prior Treatment) HPI Comments: Patient with a history of dementia presents today from [facility] after a fall.  I contacted the staff at [facility] to obtain more information about the fall.  According to RN Denny Peon the patient had an unwitnessed fall just prior to arrival.  Staff report that the patient has appeared to be more weak today.  She is normally able to ambulate well with a walker.  They report that today the patient was shuffling her feet and appeared to have difficulty ambulating.  They then got the patient a wheelchair.  When staff wasn't looking the patient attempted to get up from her wheelchair and fell to the ground.  Staff is unsure if she hit her head, but when they found her her head was resting against the metal bar of the wheelchair.  No LOC.  Patient is currently on 81 mg ASA daily, but no other anticoagulants.  Unable to obtain further history from the patient due to dementia.    Patient is a 79 y.o. female presenting with fall. The history is provided by the patient.  Fall    Past Medical History  Diagnosis Date  . Depression   . GERD (gastroesophageal reflux disease)   . Dementia   . CAD (coronary artery disease)   . Cardiomyopathy   . Osteoporosis   . Constipation   . Failure to thrive in childhood    Past Surgical History  Procedure Laterality Date  . Appendectomy     Family History  Problem Relation Age of Onset  . Alzheimer's disease    . Coronary artery disease     History  Substance Use Topics  . Smoking status: Former Smoker    Quit date: 10/25/2002  . Smokeless tobacco: Never Used  . Alcohol Use: No   OB History   Grav Para Term Preterm Abortions TAB SAB Ect Mult Living                 Review of Systems  Unable to perform ROS: Dementia    Allergies  Review of patient's allergies indicates no known allergies.  Home Medications   Current Outpatient Rx  Name  Route  Sig  Dispense  Refill  . acetaminophen (TYLENOL) 325 MG tablet   Oral   Take 325-650 mg by mouth every 6 (six) hours as needed. For pain.         Marland Kitchen aspirin 81 MG chewable tablet   Oral   Chew 81 mg by mouth every morning.         . diclofenac sodium (VOLTAREN) 1 % GEL   Topical   Apply 2 g topically 2 (two) times daily. Apply to right knee         . donepezil (ARICEPT) 5 MG tablet   Oral   Take 5 mg by mouth at bedtime.         Marland Kitchen doxycycline (VIBRA-TABS) 100 MG tablet   Oral   Take 1 tablet (100 mg total) by mouth every 12 (twelve) hours.   14 tablet   0   . fluconazole (DIFLUCAN) 100 MG tablet   Oral   Take 100 mg by mouth every other day. x7 doses         .  haloperidol (HALDOL) 1 MG tablet   Oral   Take 1 mg by mouth at bedtime.         Marland Kitchen HYDROcodone-acetaminophen (NORCO/VICODIN) 5-325 MG per tablet   Oral   Take 1 tablet by mouth every 8 (eight) hours as needed for pain (significant foot pain).   20 tablet   0   . losartan (COZAAR) 50 MG tablet   Oral   Take 50 mg by mouth every morning.         . mupirocin cream (BACTROBAN) 2 %      Apply topically 1-2 (two) times daily to foot whenever dressing is changed   30 g   1   . nystatin (MYCOSTATIN/NYSTOP) 100000 UNIT/GM POWD               . omeprazole (PRILOSEC) 20 MG capsule   Oral   Take 20 mg by mouth every morning.         . polyethylene glycol (MIRALAX / GLYCOLAX) packet   Oral   Take 17 g by mouth daily as needed.   14 each      . PROAIR HFA 108 (90 BASE) MCG/ACT inhaler               . raloxifene (EVISTA) 60 MG tablet   Oral   Take 60 mg by mouth every morning.         . sertraline (ZOLOFT) 25 MG tablet   Oral   Take 25 mg by mouth at bedtime.          BP 157/77  Pulse 100   Temp(Src) 99.2 F (37.3 C) (Oral)  Resp 16  SpO2 99% Physical Exam  Nursing note and vitals reviewed. Constitutional: She appears well-developed and well-nourished.  HENT:  Head: Normocephalic and atraumatic.  Mouth/Throat: Oropharynx is clear and moist.  Eyes: EOM are normal. Pupils are equal, round, and reactive to light.  Neck: Normal range of motion. Neck supple.  Cardiovascular: Normal rate, regular rhythm and normal heart sounds.   Pulmonary/Chest: Effort normal and breath sounds normal.  Abdominal: Soft. Bowel sounds are normal. She exhibits no distension and no mass. There is no tenderness. There is no rebound and no guarding.  Musculoskeletal: Normal range of motion.  Patient moving all extremities without apparent pain  Neurological: She is alert. She exhibits normal muscle tone.  Patient unable to fully cooperate with neurological exam. Normal muscle tone of all extremities.   Skin: Skin is warm, dry and intact. No abrasion, no bruising and no ecchymosis noted.  Psychiatric: She has a normal mood and affect.    ED Course  Procedures (including critical care time) Labs Review Labs Reviewed  CBC  COMPREHENSIVE METABOLIC PANEL  URINALYSIS, ROUTINE W REFLEX MICROSCOPIC   Imaging Review Ct Head Wo Contrast  09/11/2013   CLINICAL DATA:  Status post fall.  EXAM: CT HEAD WITHOUT CONTRAST  TECHNIQUE: Contiguous axial images were obtained from the base of the skull through the vertex without intravenous contrast.  COMPARISON:  None.  FINDINGS: There is no evidence of mass effect, midline shift or extra-axial fluid collections. There is no evidence of a space-occupying lesion or intracranial hemorrhage. There is no evidence of a cortical-based area of acute infarction. There is bilateral periventricular white matter low attenuation likely secondary to microangiopathy. There is generalized cerebral atrophy.  The ventricles and sulci are appropriate for the patient's age. The basal  cisterns are patent.  Visualized portions of the orbits are unremarkable.  There is a right maxillary sinus mucous retention cyst. There is a mucous retention cyst in the right ethmoid sinus.  The osseous structures are unremarkable.  IMPRESSION: No acute intracranial pathology.   Electronically Signed   By: Elige Ko   On: 09/11/2013 15:48   Dg Chest Portable 1 View  09/11/2013   CLINICAL DATA:  79 year old female status post fall. Initial encounter.  EXAM: PORTABLE CHEST - 1 VIEW  COMPARISON:  03/06/2004 and earlier.  FINDINGS: Portable AP semi upright view at at 1439 hrs. Low lung volumes. Stable cardiac size and mediastinal contours. Sequelae of CABG. No pneumothorax or pulmonary edema. No pleural effusion or consolidation identified.  IMPRESSION: Low lung volumes, otherwise no acute cardiopulmonary abnormality.   Electronically Signed   By: Augusto Gamble M.D.   On: 09/11/2013 15:05    EKG Interpretation   None      Patient discussed with Dr. Estell Harpin.  5:43 PM Discussed with Dr. Felipa Eth with [facility].  He reports that he feels that the patient can be discharged.  He states that the patient can follow up with Dr. Timothy Lasso tomorrow.  He also recommends starting the patient on Levaquin 250 mg q day x 7 days.  5:55 PM Discussed plan with family.  They are in agreement with the plan.   MDM  No diagnosis found. Patient presents today from [facility] after a fall.  According to the staff at [facility] the patient appeared to be more weak today.  Patient moving all extremities without pain.  Patient is febrile in the ED.  Patient with a rectal temp of 101.2 F.  Labs today showing an elevated lactate, but normal WBC.  Labs otherwise unremarkable.  UA unremarkable.  CXR negative.  Abdomen soft and non tender.  CT head negative.  Patient discussed with Dr. Timothy Lasso at [facility].  He recommends starting the patient on Levaquin 250 mg daily for 7 days and states that Dr. Timothy Lasso will follow  up with her tomorrow.  Family in agreement with the plan.  Patient is hemodynamically stable and stable for discharge.      Santiago Glad, PA-C 09/11/13 1816

## 2013-09-12 NOTE — ED Provider Notes (Signed)
Medical screening examination/treatment/procedure(s) were performed by non-physician practitioner and as supervising physician I was immediately available for consultation/collaboration.  EKG Interpretation   None         Jermall Isaacson L Jaydian Santana, MD 09/12/13 1453 

## 2013-12-28 ENCOUNTER — Encounter (HOSPITAL_COMMUNITY): Payer: Self-pay | Admitting: Emergency Medicine

## 2013-12-28 ENCOUNTER — Emergency Department (HOSPITAL_COMMUNITY)
Admission: EM | Admit: 2013-12-28 | Discharge: 2013-12-28 | Disposition: A | Discharge status: Home / Self Care | Payer: Medicare Other | Attending: Emergency Medicine | Admitting: Emergency Medicine

## 2013-12-28 ENCOUNTER — Emergency Department (HOSPITAL_COMMUNITY): Payer: Medicare Other

## 2013-12-28 DIAGNOSIS — K219 Gastro-esophageal reflux disease without esophagitis: Secondary | ICD-10-CM | POA: Insufficient documentation

## 2013-12-28 DIAGNOSIS — F329 Major depressive disorder, single episode, unspecified: Secondary | ICD-10-CM | POA: Insufficient documentation

## 2013-12-28 DIAGNOSIS — Z79899 Other long term (current) drug therapy: Secondary | ICD-10-CM | POA: Insufficient documentation

## 2013-12-28 DIAGNOSIS — F3289 Other specified depressive episodes: Secondary | ICD-10-CM | POA: Insufficient documentation

## 2013-12-28 DIAGNOSIS — I2510 Atherosclerotic heart disease of native coronary artery without angina pectoris: Secondary | ICD-10-CM | POA: Insufficient documentation

## 2013-12-28 DIAGNOSIS — R4182 Altered mental status, unspecified: Secondary | ICD-10-CM | POA: Insufficient documentation

## 2013-12-28 DIAGNOSIS — F29 Unspecified psychosis not due to a substance or known physiological condition: Secondary | ICD-10-CM | POA: Insufficient documentation

## 2013-12-28 DIAGNOSIS — Z87891 Personal history of nicotine dependence: Secondary | ICD-10-CM | POA: Insufficient documentation

## 2013-12-28 DIAGNOSIS — N390 Urinary tract infection, site not specified: Secondary | ICD-10-CM | POA: Insufficient documentation

## 2013-12-28 DIAGNOSIS — Z7982 Long term (current) use of aspirin: Secondary | ICD-10-CM | POA: Insufficient documentation

## 2013-12-28 DIAGNOSIS — R609 Edema, unspecified: Secondary | ICD-10-CM | POA: Insufficient documentation

## 2013-12-28 DIAGNOSIS — F0390 Unspecified dementia without behavioral disturbance: Secondary | ICD-10-CM | POA: Insufficient documentation

## 2013-12-28 DIAGNOSIS — M810 Age-related osteoporosis without current pathological fracture: Secondary | ICD-10-CM | POA: Insufficient documentation

## 2013-12-28 DIAGNOSIS — R509 Fever, unspecified: Secondary | ICD-10-CM

## 2013-12-28 DIAGNOSIS — K5900 Constipation, unspecified: Secondary | ICD-10-CM | POA: Insufficient documentation

## 2013-12-28 LAB — CBC WITH DIFFERENTIAL/PLATELET
BASOS ABS: 0 10*3/uL (ref 0.0–0.1)
Basophils Relative: 0 % (ref 0–1)
Eosinophils Absolute: 0 10*3/uL (ref 0.0–0.7)
Eosinophils Relative: 0 % (ref 0–5)
HCT: 34.1 % — ABNORMAL LOW (ref 36.0–46.0)
Hemoglobin: 11.3 g/dL — ABNORMAL LOW (ref 12.0–15.0)
LYMPHS ABS: 0.8 10*3/uL (ref 0.7–4.0)
LYMPHS PCT: 12 % (ref 12–46)
MCH: 31.2 pg (ref 26.0–34.0)
MCHC: 33.1 g/dL (ref 30.0–36.0)
MCV: 94.2 fL (ref 78.0–100.0)
MONO ABS: 0.7 10*3/uL (ref 0.1–1.0)
Monocytes Relative: 10 % (ref 3–12)
NEUTROS ABS: 5.3 10*3/uL (ref 1.7–7.7)
Neutrophils Relative %: 77 % (ref 43–77)
Platelets: 152 10*3/uL (ref 150–400)
RBC: 3.62 MIL/uL — AB (ref 3.87–5.11)
RDW: 14.3 % (ref 11.5–15.5)
WBC: 6.9 10*3/uL (ref 4.0–10.5)

## 2013-12-28 LAB — COMPREHENSIVE METABOLIC PANEL
ALT: 10 U/L (ref 0–35)
AST: 20 U/L (ref 0–37)
Albumin: 3.8 g/dL (ref 3.5–5.2)
Alkaline Phosphatase: 76 U/L (ref 39–117)
BILIRUBIN TOTAL: 0.5 mg/dL (ref 0.3–1.2)
BUN: 21 mg/dL (ref 6–23)
CO2: 25 meq/L (ref 19–32)
CREATININE: 1.18 mg/dL — AB (ref 0.50–1.10)
Calcium: 8.7 mg/dL (ref 8.4–10.5)
Chloride: 105 mEq/L (ref 96–112)
GFR calc Af Amer: 45 mL/min — ABNORMAL LOW (ref >90)
GFR, EST NON AFRICAN AMERICAN: 39 mL/min — AB (ref >90)
GLUCOSE: 114 mg/dL — AB (ref 70–99)
Potassium: 4.3 mEq/L (ref 3.7–5.3)
Sodium: 144 mEq/L (ref 137–147)
Total Protein: 6.9 g/dL (ref 6.0–8.3)

## 2013-12-28 LAB — I-STAT CG4 LACTIC ACID, ED: LACTIC ACID, VENOUS: 1.04 mmol/L (ref 0.5–2.2)

## 2013-12-28 LAB — URINALYSIS, ROUTINE W REFLEX MICROSCOPIC
Glucose, UA: NEGATIVE mg/dL
Ketones, ur: NEGATIVE mg/dL
Nitrite: NEGATIVE
PROTEIN: 100 mg/dL — AB
Specific Gravity, Urine: 1.021 (ref 1.005–1.030)
UROBILINOGEN UA: 1 mg/dL (ref 0.0–1.0)
pH: 6 (ref 5.0–8.0)

## 2013-12-28 LAB — URINE MICROSCOPIC-ADD ON

## 2013-12-28 MED ORDER — CEPHALEXIN 250 MG PO CAPS
500.0000 mg | ORAL_CAPSULE | Freq: Once | ORAL | Status: AC
  Administered 2013-12-28: 500 mg via ORAL
  Filled 2013-12-28: qty 2

## 2013-12-28 MED ORDER — ACETAMINOPHEN 325 MG PO TABS
650.0000 mg | ORAL_TABLET | Freq: Once | ORAL | Status: AC
  Administered 2013-12-28: 650 mg via ORAL
  Filled 2013-12-28: qty 2

## 2013-12-28 MED ORDER — CEPHALEXIN 500 MG PO CAPS: 500.0000 mg | ORAL_CAPSULE | Freq: Four times a day (QID) | ORAL | Status: DC

## 2013-12-28 NOTE — ED Notes (Signed)
Patient transported to CT 

## 2013-12-28 NOTE — ED Notes (Signed)
[facility] has been notified of pt's status, states [facility]

## 2013-12-28 NOTE — ED Provider Notes (Addendum)
CSN: [id number]     Arrival date & time 12/28/13  1737 History   First MD Initiated Contact with Patient 12/28/13 1738     Chief Complaint  Patient presents with  . Fever     (Consider location/radiation/quality/duration/timing/severity/associated sxs/prior Treatment) Patient is a 80 y.o. female presenting with fever. The history is provided by the nursing home and the EMS personnel. The history is limited by the condition of the patient and the absence of a caregiver.  Fever Max temp prior to arrival:  101.9 Temp source:  Oral Severity:  Moderate Onset quality:  Gradual Duration:  24 hours Timing:  Constant Progression:  Unchanged Chronicity:  New Relieved by:  None tried Worsened by:  Nothing tried Ineffective treatments:  None tried Associated symptoms: confusion   Associated symptoms: no cough, no diarrhea, no headaches, no rhinorrhea and no vomiting   Risk factors: no immunosuppression, no recent travel and no sick contacts     Past Medical History  Diagnosis Date  . Depression   . GERD (gastroesophageal reflux disease)   . Dementia   . CAD (coronary artery disease)   . Cardiomyopathy   . Osteoporosis   . Constipation   . Failure to thrive in childhood    Past Surgical History  Procedure Laterality Date  . Appendectomy     Family History  Problem Relation Age of Onset  . Alzheimer's disease    . Coronary artery disease     History  Substance Use Topics  . Smoking status: Former Smoker    Quit date: 10/25/2002  . Smokeless tobacco: Never Used  . Alcohol Use: No   OB History   Grav Para Term Preterm Abortions TAB SAB Ect Mult Living                 Review of Systems  Constitutional: Positive for fever.  HENT: Negative for rhinorrhea.   Respiratory: Negative for cough.   Gastrointestinal: Negative for vomiting and diarrhea.  Neurological: Negative for headaches.  Psychiatric/Behavioral: Positive for confusion.  All other systems reviewed and are  negative.      Allergies  Review of patient's allergies indicates no known allergies.  Home Medications   Current Outpatient Rx  Name  Route  Sig  Dispense  Refill  . acetaminophen (TYLENOL) 325 MG tablet   Oral   Take 650 mg by mouth every 6 (six) hours as needed (pain).          Marland Kitchen albuterol (PROVENTIL HFA;VENTOLIN HFA) 108 (90 BASE) MCG/ACT inhaler   Inhalation   Inhale 2 puffs into the lungs 3 (three) times daily. 6am, 12 noon and 6pm         . aspirin 81 MG chewable tablet   Oral   Chew 81 mg by mouth daily at 6 (six) AM.          . benzonatate (TESSALON) 200 MG capsule   Oral   Take 200 mg by mouth 3 (three) times daily as needed for cough.         . donepezil (ARICEPT) 5 MG tablet   Oral   Take 5 mg by mouth at bedtime.         . furosemide (LASIX) 40 MG tablet   Oral   Take 40 mg by mouth daily at 6 (six) AM.         . haloperidol (HALDOL) 1 MG tablet   Oral   Take 1 mg by mouth at bedtime.         Marland Kitchen  losartan (COZAAR) 50 MG tablet   Oral   Take 50 mg by mouth daily at 6 (six) AM.          . neomycin-bacitracin-polymyxin (NEOSPORIN) OINT   Topical   Apply 1 application topically daily as needed for wound care. Apply to right 2nd toe daily until healed         . Nutritional Supplements (ENSURE PO)   Oral   Take 237 mLs by mouth 2 (two) times daily. Between meals at 10 am and 2 pm         . nystatin (MYCOSTATIN/NYSTOP) 100000 UNIT/GM POWD   Topical   Apply topically 2 (two) times daily. 6am and 6pm - apply under breasts         . omeprazole (PRILOSEC) 20 MG capsule   Oral   Take 20 mg by mouth daily at 6 (six) AM.          . potassium chloride SA (K-DUR,KLOR-CON) 20 MEQ tablet   Oral   Take 20 mEq by mouth once a week. Take every Sunday morning with Lasix (furosemide)         . Protein (PROCEL) POWD   Oral   Take 2 scoop by mouth 3 (three) times daily. Mix 2 scoops in 4 oz of milk/juice and drink at 8am, 2pm and 8pm          . Psyllium (METAMUCIL PO)   Oral   Take 1 scoop by mouth daily at 6 (six) AM. Mix in liquid and drink - for gas         . raloxifene (EVISTA) 60 MG tablet   Oral   Take 60 mg by mouth daily at 6 (six) AM.          . sertraline (ZOLOFT) 25 MG tablet   Oral   Take 25 mg by mouth at bedtime.         . white petrolatum (VASELINE) GEL   Topical   Apply 1 application topically 2 (two) times daily as needed (wound care). Apply to facial lesions until healed          BP 134/94  Pulse 72  Temp(Src) 98.3 F (36.8 C) (Oral)  Resp 14  SpO2 96% Physical Exam  Nursing note and vitals reviewed. Constitutional: She appears well-developed and well-nourished. No distress.  HENT:  Head: Normocephalic and atraumatic.  Mouth/Throat: Oropharynx is clear and moist.  Eyes: Conjunctivae and EOM are normal. Pupils are equal, round, and reactive to light.  Neck: Normal range of motion. Neck supple.  Cardiovascular: Normal rate, regular rhythm and intact distal pulses.   No murmur heard. Pulmonary/Chest: Effort normal. No respiratory distress. She has decreased breath sounds in the right lower field and the left lower field. She has no wheezes. She has no rales.  Abdominal: Soft. She exhibits no distension. There is no tenderness. There is no rebound and no guarding.  Musculoskeletal: Normal range of motion. She exhibits edema. She exhibits no tenderness.  3+ pitting edema in the right lower ext to mid shin and 1+ on the left.  Neurological: She is alert.  Pt oriented to person only.  Not able to answer questions appropriately.  Moving all ext without difficulty  Skin: Skin is warm and dry. No rash noted. No erythema.  Psychiatric:  confused    ED Course  Procedures (including critical care time) Labs Review Labs Reviewed  CBC WITH DIFFERENTIAL - Abnormal; Notable for the following:    RBC 3.62 (*)  Hemoglobin 11.3 (*)    HCT 34.1 (*)    All other components within normal  limits  COMPREHENSIVE METABOLIC PANEL - Abnormal; Notable for the following:    Glucose, Bld 114 (*)    Creatinine, Ser 1.18 (*)    GFR calc non Af Amer 39 (*)    GFR calc Af Amer 45 (*)    All other components within normal limits  URINALYSIS, ROUTINE W REFLEX MICROSCOPIC - Abnormal; Notable for the following:    APPearance CLOUDY (*)    Hgb urine dipstick SMALL (*)    Bilirubin Urine SMALL (*)    Protein, ur 100 (*)    Leukocytes, UA SMALL (*)    All other components within normal limits  URINE MICROSCOPIC-ADD ON - Abnormal; Notable for the following:    Bacteria, UA MANY (*)    Casts HYALINE CASTS (*)    All other components within normal limits  I-STAT CG4 LACTIC ACID, ED   Imaging Review Ct Head Wo Contrast  12/28/2013   CLINICAL DATA:  Fever.  Mental status change.  EXAM: CT HEAD WITHOUT CONTRAST  TECHNIQUE: Contiguous axial images were obtained from the base of the skull through the vertex without intravenous contrast.  COMPARISON:  Head CT 09/11/2013.  FINDINGS: Mild cerebral and cerebellar atrophy. Patchy areas of low attenuation throughout the deep and periventricular white matter of the cerebral hemispheres bilaterally, most compatible with chronic microvascular ischemic disease. No acute intracranial abnormalities. Specifically, no evidence of acute intracranial hemorrhage, no definite findings of acute/subacute cerebral ischemia, no mass, mass effect, hydrocephalus or abnormal intra or extra-axial fluid collections. No acute displaced skull fractures are identified. Mastoids are well pneumatized. Paranasal sinuses are remarkable for multifocal mucosal thickening in the ethmoid sinuses bilaterally, post procedural changes of right maxillary antrectomy, a high attenuation lesion in the right ethmoid sinus anteriorly, most compatible with a mucosal retention cyst (with inspissated contents), and a small mucosal retention cyst versus polyp in the right maxillary sinus.  IMPRESSION: 1.  No acute intracranial abnormalities. 2. Mild cerebral and cerebellar atrophy with chronic microvascular ischemic changes in the cerebral white matter redemonstrated, as above. 3. Chronic paranasal sinus disease, as above.   Electronically Signed   By: Trudie Reed M.D.   On: 12/28/2013 19:26   Dg Chest Port 1 View  12/28/2013   CLINICAL DATA:  Fever.  EXAM: PORTABLE CHEST - 1 VIEW  COMPARISON:  09/11/2013  FINDINGS: Prior CABG. Heart is borderline in size. No definite confluent airspace opacities other than probable scarring in the apices. The right apex is partially obscured by the overlying neck/ head. No effusions. No acute bony abnormality.  IMPRESSION: No acute cardiopulmonary disease.   Electronically Signed   By: Charlett Nose M.D.   On: 12/28/2013 18:51     EKG Interpretation   Date/Time:  Friday December 28 2013 18:39:57 EST Ventricular Rate:  87 PR Interval:  164 QRS Duration: 139 QT Interval:  444 QTC Calculation: 534 R Axis:   -19 Text Interpretation:  Sinus rhythm Atrial premature complex Left bundle  branch block No significant change since last tracing Confirmed by  Anitra Lauth  MD, Alphonzo Lemmings ([id number]) on 12/28/2013 7:09:13 PM      MDM   Final diagnoses:  UTI (lower urinary tract infection)  Fever   Patient presents from nursing facility with a 24-hour history of fever, change in mental status. On exam patient is awake and alert but is unable to answer questions appropriately. She is hot  to the touch but has no reproducible pain. She does have notable swelling to the bilateral lower extremities right greater than LOC. Spoke with the nursing home and they deny any recent fall or and states that her mental status has been declining but abrupt decline today. Had a temperature of 101.9 at the nursing home. No medications were given.  No new history of URI symptoms however chest x-ray, head CT, CBC, CMP, UA, lactate pending. Patient denies any vomiting and has moist mucous membranes. No  signs of cellulitis concern for possible UTI as her source. Patient currently has no nuchal rigidity or symptoms concerning for meningitis.  7:19 PM Pt's labs consistent with UTI with complicating features.  Head CT and CXR without acute findings.  Tolerating po's and in no acute distress.  Will treat with abx and d/c back to nursing home.  Gwyneth SproutWhitney Makaylah Oddo, MD 12/28/13 1940  Gwyneth SproutWhitney Shauna Bodkins, MD 12/28/13 1945

## 2013-12-28 NOTE — ED Notes (Signed)
[facility] [place]. Report given to both [facility] and [facility] at [facility]. Pt alert, denies pain, and is interactive, in NAD.

## 2013-12-28 NOTE — ED Notes (Signed)
Dr. Anitra LauthPlunkett informed of CG-4 result

## 2013-12-28 NOTE — ED Notes (Signed)
[facility]. EMS reports pt with fever, right ankle swelling and change in mental status.

## 2013-12-28 NOTE — ED Notes (Signed)
Contacted Al[facility] at [facility] at [phone]. States that patient developed fever this morning 101.25F temporal with increased BP 145/78. Reports patient almost fell today and is normally independent with walker. Notes right ankle also developed edema today. States that patient has been gradually declining in mental status over the past few months, however today staff noticed a sharp decline. Dr. Anitra LauthPlunkett made aware of this information.

## 2013-12-31 LAB — URINE CULTURE: Special Requests: NORMAL

## 2014-11-12 ENCOUNTER — Inpatient Hospital Stay (HOSPITAL_COMMUNITY)
Admission: EM | Admit: 2014-11-12 | Discharge: 2014-11-18 | DRG: 193 | Disposition: A | Discharge status: Skilled Nursing Facility | Payer: Medicare Other | Attending: Internal Medicine | Admitting: Internal Medicine

## 2014-11-12 ENCOUNTER — Encounter (HOSPITAL_COMMUNITY): Payer: Self-pay

## 2014-11-12 ENCOUNTER — Emergency Department (HOSPITAL_COMMUNITY): Payer: Medicare Other

## 2014-11-12 DIAGNOSIS — Z681 Body mass index (BMI) 19 or less, adult: Secondary | ICD-10-CM

## 2014-11-12 DIAGNOSIS — Z66 Do not resuscitate: Secondary | ICD-10-CM | POA: Diagnosis present

## 2014-11-12 DIAGNOSIS — I509 Heart failure, unspecified: Secondary | ICD-10-CM | POA: Diagnosis present

## 2014-11-12 DIAGNOSIS — K219 Gastro-esophageal reflux disease without esophagitis: Secondary | ICD-10-CM | POA: Diagnosis present

## 2014-11-12 DIAGNOSIS — R0602 Shortness of breath: Secondary | ICD-10-CM | POA: Diagnosis not present

## 2014-11-12 DIAGNOSIS — Z820 Family history of epilepsy and other diseases of the nervous system: Secondary | ICD-10-CM

## 2014-11-12 DIAGNOSIS — J9691 Respiratory failure, unspecified with hypoxia: Secondary | ICD-10-CM | POA: Diagnosis present

## 2014-11-12 DIAGNOSIS — N179 Acute kidney failure, unspecified: Secondary | ICD-10-CM | POA: Diagnosis present

## 2014-11-12 DIAGNOSIS — N184 Chronic kidney disease, stage 4 (severe): Secondary | ICD-10-CM | POA: Diagnosis present

## 2014-11-12 DIAGNOSIS — J189 Pneumonia, unspecified organism: Principal | ICD-10-CM | POA: Diagnosis present

## 2014-11-12 DIAGNOSIS — M1990 Unspecified osteoarthritis, unspecified site: Secondary | ICD-10-CM | POA: Diagnosis present

## 2014-11-12 DIAGNOSIS — F0390 Unspecified dementia without behavioral disturbance: Secondary | ICD-10-CM | POA: Diagnosis present

## 2014-11-12 DIAGNOSIS — Z951 Presence of aortocoronary bypass graft: Secondary | ICD-10-CM

## 2014-11-12 DIAGNOSIS — E876 Hypokalemia: Secondary | ICD-10-CM | POA: Diagnosis present

## 2014-11-12 DIAGNOSIS — Y95 Nosocomial condition: Secondary | ICD-10-CM | POA: Diagnosis present

## 2014-11-12 DIAGNOSIS — Z853 Personal history of malignant neoplasm of breast: Secondary | ICD-10-CM

## 2014-11-12 DIAGNOSIS — I129 Hypertensive chronic kidney disease with stage 1 through stage 4 chronic kidney disease, or unspecified chronic kidney disease: Secondary | ICD-10-CM | POA: Diagnosis present

## 2014-11-12 DIAGNOSIS — D61818 Other pancytopenia: Secondary | ICD-10-CM | POA: Diagnosis present

## 2014-11-12 DIAGNOSIS — I429 Cardiomyopathy, unspecified: Secondary | ICD-10-CM | POA: Diagnosis present

## 2014-11-12 DIAGNOSIS — E785 Hyperlipidemia, unspecified: Secondary | ICD-10-CM | POA: Diagnosis present

## 2014-11-12 DIAGNOSIS — Z23 Encounter for immunization: Secondary | ICD-10-CM

## 2014-11-12 DIAGNOSIS — Z8249 Family history of ischemic heart disease and other diseases of the circulatory system: Secondary | ICD-10-CM

## 2014-11-12 DIAGNOSIS — Z87891 Personal history of nicotine dependence: Secondary | ICD-10-CM

## 2014-11-12 DIAGNOSIS — Z87440 Personal history of urinary (tract) infections: Secondary | ICD-10-CM

## 2014-11-12 DIAGNOSIS — E43 Unspecified severe protein-calorie malnutrition: Secondary | ICD-10-CM | POA: Insufficient documentation

## 2014-11-12 DIAGNOSIS — I214 Non-ST elevation (NSTEMI) myocardial infarction: Secondary | ICD-10-CM | POA: Diagnosis not present

## 2014-11-12 DIAGNOSIS — Z8673 Personal history of transient ischemic attack (TIA), and cerebral infarction without residual deficits: Secondary | ICD-10-CM

## 2014-11-12 DIAGNOSIS — I25810 Atherosclerosis of coronary artery bypass graft(s) without angina pectoris: Secondary | ICD-10-CM | POA: Diagnosis present

## 2014-11-12 DIAGNOSIS — F410 Panic disorder [episodic paroxysmal anxiety] without agoraphobia: Secondary | ICD-10-CM | POA: Diagnosis present

## 2014-11-12 DIAGNOSIS — M810 Age-related osteoporosis without current pathological fracture: Secondary | ICD-10-CM | POA: Diagnosis present

## 2014-11-12 HISTORY — DX: Unspecified osteoarthritis, unspecified site: M19.90

## 2014-11-12 LAB — URINALYSIS, ROUTINE W REFLEX MICROSCOPIC
Bilirubin Urine: NEGATIVE
GLUCOSE, UA: NEGATIVE mg/dL
HGB URINE DIPSTICK: NEGATIVE
Ketones, ur: NEGATIVE mg/dL
Leukocytes, UA: NEGATIVE
NITRITE: NEGATIVE
PH: 6 (ref 5.0–8.0)
Protein, ur: NEGATIVE mg/dL
Specific Gravity, Urine: 1.011 (ref 1.005–1.030)
Urobilinogen, UA: 1 mg/dL (ref 0.0–1.0)

## 2014-11-12 LAB — BASIC METABOLIC PANEL
ANION GAP: 10 (ref 5–15)
BUN: 29 mg/dL — AB (ref 6–23)
CO2: 25 mmol/L (ref 19–32)
Calcium: 8.8 mg/dL (ref 8.4–10.5)
Chloride: 101 mEq/L (ref 96–112)
Creatinine, Ser: 1.85 mg/dL — ABNORMAL HIGH (ref 0.50–1.10)
GFR calc non Af Amer: 23 mL/min — ABNORMAL LOW (ref >90)
GFR, EST AFRICAN AMERICAN: 26 mL/min — AB (ref >90)
Glucose, Bld: 165 mg/dL — ABNORMAL HIGH (ref 70–99)
Potassium: 4.1 mmol/L (ref 3.5–5.1)
SODIUM: 136 mmol/L (ref 135–145)

## 2014-11-12 LAB — CBC WITH DIFFERENTIAL/PLATELET
BASOS ABS: 0 10*3/uL (ref 0.0–0.1)
Basophils Relative: 0 % (ref 0–1)
EOS ABS: 0 10*3/uL (ref 0.0–0.7)
EOS PCT: 1 % (ref 0–5)
HCT: 36.7 % (ref 36.0–46.0)
HEMOGLOBIN: 11.9 g/dL — AB (ref 12.0–15.0)
LYMPHS PCT: 6 % — AB (ref 12–46)
Lymphs Abs: 0.4 10*3/uL — ABNORMAL LOW (ref 0.7–4.0)
MCH: 31.2 pg (ref 26.0–34.0)
MCHC: 32.4 g/dL (ref 30.0–36.0)
MCV: 96.3 fL (ref 78.0–100.0)
Monocytes Absolute: 0.5 10*3/uL (ref 0.1–1.0)
Monocytes Relative: 7 % (ref 3–12)
Neutro Abs: 6.1 10*3/uL (ref 1.7–7.7)
Neutrophils Relative %: 87 % — ABNORMAL HIGH (ref 43–77)
PLATELETS: 111 10*3/uL — AB (ref 150–400)
RBC: 3.81 MIL/uL — ABNORMAL LOW (ref 3.87–5.11)
RDW: 14 % (ref 11.5–15.5)
WBC: 7.1 10*3/uL (ref 4.0–10.5)

## 2014-11-12 LAB — LACTIC ACID, PLASMA: Lactic Acid, Venous: 1.4 mmol/L (ref 0.5–2.2)

## 2014-11-12 LAB — TROPONIN I: TROPONIN I: 0.06 ng/mL — AB (ref <0.031)

## 2014-11-12 LAB — BRAIN NATRIURETIC PEPTIDE: B NATRIURETIC PEPTIDE 5: 217.8 pg/mL — AB (ref 0.0–100.0)

## 2014-11-12 MED ORDER — VANCOMYCIN HCL IN DEXTROSE 750-5 MG/150ML-% IV SOLN: 750.0000 mg | INTRAVENOUS | Status: DC

## 2014-11-12 MED ORDER — HALOPERIDOL 1 MG PO TABS
1.0000 mg | ORAL_TABLET | Freq: Every evening | ORAL | Status: DC | PRN
  Administered 2014-11-15: 1 mg via ORAL
  Filled 2014-11-12 (×2): qty 1

## 2014-11-12 MED ORDER — PNEUMOCOCCAL VAC POLYVALENT 25 MCG/0.5ML IJ INJ
0.5000 mL | INJECTION | INTRAMUSCULAR | Status: AC
  Administered 2014-11-13: 0.5 mL via INTRAMUSCULAR
  Filled 2014-11-12: qty 0.5

## 2014-11-12 MED ORDER — VANCOMYCIN HCL IN DEXTROSE 1-5 GM/200ML-% IV SOLN
1000.0000 mg | Freq: Once | INTRAVENOUS | Status: AC
  Administered 2014-11-12: 1000 mg via INTRAVENOUS
  Filled 2014-11-12: qty 200

## 2014-11-12 MED ORDER — PANTOPRAZOLE SODIUM 40 MG PO TBEC
40.0000 mg | DELAYED_RELEASE_TABLET | Freq: Every day | ORAL | Status: DC
  Administered 2014-11-13 – 2014-11-18 (×6): 40 mg via ORAL
  Filled 2014-11-12 (×6): qty 1

## 2014-11-12 MED ORDER — SODIUM CHLORIDE 0.9 % IV SOLN
INTRAVENOUS | Status: DC
  Administered 2014-11-12 – 2014-11-13 (×2): via INTRAVENOUS

## 2014-11-12 MED ORDER — PIPERACILLIN-TAZOBACTAM IN DEX 2-0.25 GM/50ML IV SOLN
2.2500 g | Freq: Four times a day (QID) | INTRAVENOUS | Status: DC
  Administered 2014-11-13 – 2014-11-14 (×5): 2.25 g via INTRAVENOUS
  Filled 2014-11-12 (×6): qty 50

## 2014-11-12 MED ORDER — NYSTATIN 100000 UNIT/GM EX POWD
Freq: Two times a day (BID) | CUTANEOUS | Status: DC
  Administered 2014-11-13 – 2014-11-18 (×11): via TOPICAL
  Filled 2014-11-12 (×2): qty 15

## 2014-11-12 MED ORDER — ALBUTEROL SULFATE (2.5 MG/3ML) 0.083% IN NEBU: 3.0000 mL | INHALATION_SOLUTION | Freq: Three times a day (TID) | RESPIRATORY_TRACT | Status: DC

## 2014-11-12 MED ORDER — BENZONATATE 100 MG PO CAPS
200.0000 mg | ORAL_CAPSULE | Freq: Three times a day (TID) | ORAL | Status: DC | PRN
  Filled 2014-11-12: qty 2

## 2014-11-12 MED ORDER — DONEPEZIL HCL 5 MG PO TABS
5.0000 mg | ORAL_TABLET | Freq: Every day | ORAL | Status: DC
  Administered 2014-11-12 – 2014-11-17 (×6): 5 mg via ORAL
  Filled 2014-11-12 (×7): qty 1

## 2014-11-12 MED ORDER — ASPIRIN 325 MG PO TABS
325.0000 mg | ORAL_TABLET | Freq: Once | ORAL | Status: AC
  Administered 2014-11-12: 325 mg via ORAL
  Filled 2014-11-12: qty 1

## 2014-11-12 MED ORDER — VANCOMYCIN HCL 10 G IV SOLR: 20.0000 mg/kg | Freq: Once | INTRAVENOUS | Status: DC

## 2014-11-12 MED ORDER — ASPIRIN 81 MG PO CHEW
81.0000 mg | CHEWABLE_TABLET | Freq: Every day | ORAL | Status: DC
  Administered 2014-11-13 – 2014-11-18 (×6): 81 mg via ORAL
  Filled 2014-11-12 (×7): qty 1

## 2014-11-12 MED ORDER — BENEPROTEIN PO POWD
2.0000 | ORAL | Status: DC
  Administered 2014-11-13 – 2014-11-18 (×12): 12 g via ORAL
  Filled 2014-11-12: qty 227

## 2014-11-12 MED ORDER — SERTRALINE HCL 25 MG PO TABS
25.0000 mg | ORAL_TABLET | Freq: Every day | ORAL | Status: DC
  Administered 2014-11-12 – 2014-11-17 (×6): 25 mg via ORAL
  Filled 2014-11-12 (×7): qty 1

## 2014-11-12 MED ORDER — PSYLLIUM 95 % PO PACK
1.0000 | PACK | Freq: Every day | ORAL | Status: DC
  Administered 2014-11-13 – 2014-11-15 (×3): 1 via ORAL
  Filled 2014-11-12 (×6): qty 1

## 2014-11-12 MED ORDER — PIPERACILLIN-TAZOBACTAM 3.375 G IVPB 30 MIN
3.3750 g | Freq: Once | INTRAVENOUS | Status: AC
  Administered 2014-11-12: 3.375 g via INTRAVENOUS
  Filled 2014-11-12: qty 50

## 2014-11-12 MED ORDER — FUROSEMIDE 10 MG/ML IJ SOLN
40.0000 mg | Freq: Once | INTRAMUSCULAR | Status: AC
  Administered 2014-11-12: 40 mg via INTRAVENOUS
  Filled 2014-11-12: qty 4

## 2014-11-12 MED ORDER — LOSARTAN POTASSIUM 50 MG PO TABS
50.0000 mg | ORAL_TABLET | Freq: Every day | ORAL | Status: DC
  Administered 2014-11-13 – 2014-11-18 (×6): 50 mg via ORAL
  Filled 2014-11-12 (×8): qty 1

## 2014-11-12 MED ORDER — ACETAMINOPHEN 325 MG PO TABS
650.0000 mg | ORAL_TABLET | Freq: Once | ORAL | Status: AC
  Administered 2014-11-12: 650 mg via ORAL
  Filled 2014-11-12: qty 2

## 2014-11-12 MED ORDER — ALBUTEROL SULFATE (2.5 MG/3ML) 0.083% IN NEBU
3.0000 mL | INHALATION_SOLUTION | Freq: Three times a day (TID) | RESPIRATORY_TRACT | Status: DC
  Administered 2014-11-13: 3 mL via RESPIRATORY_TRACT
  Filled 2014-11-12: qty 3

## 2014-11-12 MED ORDER — ACETAMINOPHEN 325 MG PO TABS: 650.0000 mg | ORAL_TABLET | Freq: Four times a day (QID) | ORAL | Status: DC | PRN

## 2014-11-12 MED ORDER — ENOXAPARIN SODIUM 30 MG/0.3ML ~~LOC~~ SOLN
30.0000 mg | SUBCUTANEOUS | Status: DC
  Administered 2014-11-13 – 2014-11-18 (×6): 30 mg via SUBCUTANEOUS
  Filled 2014-11-12 (×6): qty 0.3

## 2014-11-12 MED ORDER — NOREPINEPHRINE BITARTRATE 1 MG/ML IV SOLN: 2.0000 ug/min | INTRAVENOUS | Status: DC

## 2014-11-12 NOTE — H&P (Signed)
PCP:   Gwen Pounds, MD   Chief Complaint:  Shortness of breath and respiratory distress at assisted living  HPI: 81 year old female who resides at assisted living, supported by local children, including a daughter and son who are present currently in the emergency room, past medical history significant for moderate dementia, needs assistance with most of her ADLs, does have she is significant short-term memory impairment but does ability to converse and recognize her children. She does have a history of hypertension coronary artery disease status post bypass surgery cardiomyopathy congestive heart failure left bundle branch block remote rest cancer osteoporosis, reflux, early to moderate dementia with sundowning confusion and delirium adult failure to thrive and bilateral lower extremity edema as well as depression. She does walk independently with a walker and can feed herself. She has a DO NOT RESUSCITATE CODE STATUS. Shortly after lunch, at the assisted-living facility she was found to have rest or distress increased work of breathing, EMS was called, initially placed her on a see Pap machine or BiPAP machine, given significant hypoxia, transported here to the emergency room. Even initial differential including pneumonia as well as congestive heart failure patient was given 40 mg of IV Lasix. Resulting workup did include test x-ray concerning for pneumonia, BNP pending, stable EKG with left bundle branch block, mildly elevated troponin I, prerenal azotemia blood and urine cultures obtained, urinalysis unremarkable, lactic acid 1.4, patient admitted for healthcare associated pneumonia, started on Zosyn and vancomycin and I was called to admit the patient. Upon my arrival, son and daughter present, date that she is much more conversant, without respiratory distress while on oxygen, patient does desire to go home but clearly is not cognizant of her current situation as well as what brought her here to the  emergency room. Daughter did confirm DO NOT RESUSCITATE CODE STATUS. Oxygen saturation near-normal on 2 L by nasal cannula, hemodynamically stable as below.  Review of Systems:  Unclear given short-term memory deficits but no perceived recent fevers, chills, last seen by family proximally 1 week ago, typically can feed herself when provided food and assistance, does walk with a walker, no recent falls, no significant issues with taking medications, currently patient herself with her mild to moderate dementia denies any shaking chills, respiratory distress, chest pain, headaches, visual abnormalities swallowing difficulties, nausea, vomiting, abdominal pain but does admit to bilateral lower extremity pain which is somewhat chronic per family report.  Past Medical History: Past Medical History  Diagnosis Date  . Depression   . GERD (gastroesophageal reflux disease)   . Dementia   . CAD (coronary artery disease)   . Cardiomyopathy   . Osteoporosis   . Constipation   . Failure to thrive in childhood    Hypertension, coronary artery disease with history of bypass surgery 2005, cardiomyopathy congestive heart failure hyperlipidemia left bundle branch block, breast cancer 2005, 2 processes, colon polyps, diverticulosis, history of TIA, arthritis, GERD, anemia, mild to moderate dementia with sundowning confusion delirium, adult failure to thrive, recurrent UTIs, electrolyte issues, relative anorexia and malnutrition, depression, high-frequency hearing loss, history of bleeding ulcer remote  Past Surgical History  Procedure Laterality Date  . Appendectomy     bypass surgery 2005, with associated left hemidiaphragm elevated, cataract surgery bilateral  Family history nonpertinent Social history widowed 1971, 4 children 6 grandchildren and 4 great-grandchildren lives at [facility] assisted living remote tobacco abuse  Medications: Prior to Admission medications   Medication Sig Start Date End  Date Taking? Authorizing Provider  acetaminophen (TYLENOL) 325 MG tablet Take 650 mg by mouth every 6 (six) hours as needed (pain).    Yes Historical Provider, MD  albuterol (PROVENTIL HFA;VENTOLIN HFA) 108 (90 BASE) MCG/ACT inhaler Inhale 2 puffs into the lungs 3 (three) times daily. 6am, 12 noon and 6pm   Yes Historical Provider, MD  aspirin 81 MG chewable tablet Chew 81 mg by mouth daily at 6 (six) AM.    Yes Historical Provider, MD  benzonatate (TESSALON) 200 MG capsule Take 200 mg by mouth 3 (three) times daily as needed for cough.   Yes Historical Provider, MD  donepezil (ARICEPT) 5 MG tablet Take 5 mg by mouth at bedtime.   Yes Historical Provider, MD  furosemide (LASIX) 80 MG tablet Take 80 mg by mouth daily.   Yes Historical Provider, MD  haloperidol (HALDOL) 1 MG tablet Take 1 mg by mouth at bedtime as needed for agitation.    Yes Historical Provider, MD  losartan (COZAAR) 50 MG tablet Take 50 mg by mouth daily at 6 (six) AM.    Yes Historical Provider, MD  nystatin (MYCOSTATIN/NYSTOP) 100000 UNIT/GM POWD Apply topically 2 (two) times daily. 6am and 6pm - apply under breasts 09/06/13  Yes Historical Provider, MD  omeprazole (PRILOSEC) 20 MG capsule Take 20 mg by mouth daily at 6 (six) AM.    Yes Historical Provider, MD  potassium chloride SA (K-DUR,KLOR-CON) 20 MEQ tablet Take 20 mEq by mouth daily.    Yes Historical Provider, MD  Protein (PROCEL) POWD Take 2 scoop by mouth 3 (three) times daily. Mix 2 scoops in 4 oz of milk/juice and drink at 8am, 2pm and 8pm   Yes Historical Provider, MD  Psyllium (METAMUCIL PO) Take 1 scoop by mouth daily at 6 (six) AM. Mix in liquid and drink - for gas   Yes Historical Provider, MD  raloxifene (EVISTA) 60 MG tablet Take 60 mg by mouth daily at 6 (six) AM.    Yes Historical Provider, MD  sertraline (ZOLOFT) 25 MG tablet Take 25 mg by mouth at bedtime.   Yes Historical Provider, MD  triamcinolone cream (KENALOG) 0.1 % Apply 1 application topically 3  (three) times daily.   Yes Historical Provider, MD  cephALEXin (KEFLEX) 500 MG capsule Take 1 capsule (500 mg total) by mouth 4 (four) times daily. Patient not taking: Reported on 11/12/2014 12/28/13   Gwyneth SproutWhitney Plunkett, MD    Allergies:  No Known Allergies  Social History:  reports that she quit smoking about 12 years ago. She has never used smokeless tobacco. She reports that she does not drink alcohol or use illicit drugs.  Family History: Family History  Problem Relation Age of Onset  . Alzheimer's disease    . Coronary artery disease      Physical Exam: Filed Vitals:   11/12/14 1830 11/12/14 1845 11/12/14 1900 11/12/14 1915  BP: 127/79 134/64 113/60 118/76  Pulse: 88 82 83 82  Temp:      TempSrc:      Resp: 23 19 24 20   Weight:      SpO2: 99% 100% 99% 98%   Appearance frail, no acute distress sitting in bed, with oxygen in place, following simple commands Lids normal, sclera anicteric, face symmetric, atraumatic No oropharyngeal lesions, tongue midline X line neck supple, no cervical lymphadenopathy No increased work of breathing, no use of intercostal muscles, on oxygen, clear without evidence of bronchospasm, decreased at the bases Cardiovascular reveals regular rate and rhythm with mild murmur No  axillary lymphadenopathy Abdomen soft, nontender, nondistended, no masses palpated Edema 1-2 plus bilateral up to the knees, venous stasis changes, minimal erythema Pulses 1-2 plus Extremities reveal significant hand deformities but no active synovitis, strictures present, kyphosis present patient can move all 4 extremities against gravity No resting tremors Alert and oriented to the fact that she is in the hospital wanting to go home alert to person     Labs on Admission:   Recent Labs  11/12/14 1715  NA 136  K 4.1  CL 101  CO2 25  GLUCOSE 165*  BUN 29*  CREATININE 1.85*  CALCIUM 8.8   No results for input(s): AST, ALT, ALKPHOS, BILITOT, PROT, ALBUMIN in the  last 72 hours. No results for input(s): LIPASE, AMYLASE in the last 72 hours.  Recent Labs  11/12/14 1715  WBC 7.1  NEUTROABS 6.1  HGB 11.9*  HCT 36.7  MCV 96.3  PLT 111*    Recent Labs  11/12/14 1715  TROPONINI 0.06*   No results for input(s): TSH, T4TOTAL, T3FREE, THYROIDAB in the last 72 hours.  Invalid input(s): FREET3 No results for input(s): VITAMINB12, FOLATE, FERRITIN, TIBC, IRON, RETICCTPCT in the last 72 hours.  Radiological Exams on Admission: Dg Chest 1 View  11/12/2014   CLINICAL DATA:  Altered mental status and shortness of breath.  EXAM: CHEST - 1 VIEW  COMPARISON:  12/28/2013  FINDINGS: Patient appears to be kyphotic and the jaw overlies the upper chest. New densities at the right costophrenic angle could represent overlying shadows but indeterminate. Slightly prominent peribronchial and interstitial lung markings. Heart size is normal with median sternotomy wires.  IMPRESSION: Limited exam due to patient positioning. Consider follow-up imaging.  Cannot exclude densities at the right costophrenic angle.  Mild peribronchial thickening and cannot exclude mild edema.   Electronically Signed   By: Richarda Overlie M.D.   On: 11/12/2014 16:58   Orders placed or performed during the hospital encounter of 11/12/14  . EKG 12-Lead  . EKG 12-Lead  . ED EKG  . ED EKG   Left bundle branch block old Assessment/Plan Healthcare associated and acquired pneumonia-well compensated on current nasal cannula oxygen, no respiratory distress, chest x-ray reviewed, no elevation of white blood cell count, hemodynamically stable, will continue Zosyn and vancomycin ordered in the emergency room, and follow-up on blood cultures and follow-up chest x-ray CAD with history of congestive heart failure EF 40-45 percent from echocardiogram 2007-appears clinically compensated, left bundle branch block on EKG old, marginally elevated troponin I in the setting of chronic kidney disease, will not be  aggressive and management given DO NOT RESUSCITATE CODE STATUS, mild/moderate dementia, patient is not a candidate for aggressive intervention, will continue aspirin therapy Acute renal insufficiency on top of chronic kidney disease-BNP pending, patient was given a dose of Lasix, we'll administer low-dose IV fluids, monitor for any evidence of volume overload, suspect respiratory distresses secondary to infectious etiology especially given low-grade fever. Malnutrition history-will feed patient Dementia mild to moderate-will continue routine medications including that for dementia and agitation as needed Bilateral leg edema with history of compression stocking use-will continue supportive care, but will need to be careful not to over diuresis legs elevated DVT prophylaxis DO NOT RESUSCITATE CODE STATUS confirmed with son and daughter Disposition pending culture results, stabilization, and possible return to assisted living.   Tylin Force R 11/12/2014, 8:37 PM

## 2014-11-12 NOTE — ED Notes (Signed)
Report attempt x 1 

## 2014-11-12 NOTE — Progress Notes (Signed)
ANTIBIOTIC CONSULT NOTE - INITIAL  Pharmacy Consult for vancomycin + zosyn Indication: rule out pneumonia  No Known Allergies  Patient Measurements: Height: 5' 4.17" (163 cm) Weight: 116 lb (52.617 kg) (from facility today) IBW/kg (Calculated) : 55.1 Adjusted Body Weight:   Vital Signs: Temp: 97.8 F (36.6 C) (01/19 2123) Temp Source: Oral (01/19 2123) BP: 127/59 mmHg (01/19 2145) Pulse Rate: 94 (01/19 2145) Intake/Output from previous day:   Intake/Output from this shift:    Labs:  Recent Labs  11/12/14 1715  WBC 7.1  HGB 11.9*  PLT 111*  CREATININE 1.85*   Estimated Creatinine Clearance: 16.1 mL/min (by C-G formula based on Cr of 1.85). No results for input(s): VANCOTROUGH, VANCOPEAK, VANCORANDOM, GENTTROUGH, GENTPEAK, GENTRANDOM, TOBRATROUGH, TOBRAPEAK, TOBRARND, AMIKACINPEAK, AMIKACINTROU, AMIKACIN in the last 72 hours.   Microbiology: No results found for this or any previous visit (from the past 720 hour(s)).  Medical History: Past Medical History  Diagnosis Date  . Depression   . GERD (gastroesophageal reflux disease)   . Dementia   . CAD (coronary artery disease)   . Cardiomyopathy   . Osteoporosis   . Constipation   . Failure to thrive in childhood     Medications:  Anti-infectives    Start     Dose/Rate Route Frequency Ordered Stop   11/14/14 2200  vancomycin (VANCOCIN) IVPB 750 mg/150 ml premix     750 mg150 mL/hr over 60 Minutes Intravenous Every 48 hours 11/12/14 2156     11/13/14 0300  piperacillin-tazobactam (ZOSYN) IVPB 2.25 g     2.25 g100 mL/hr over 30 Minutes Intravenous Every 6 hours 11/12/14 2156     11/12/14 2000  vancomycin (VANCOCIN) IVPB 1000 mg/200 mL premix     1,000 mg200 mL/hr over 60 Minutes Intravenous  Once 11/12/14 1948     11/12/14 1945  vancomycin (VANCOCIN) 1,052 mg in sodium chloride 0.9 % 250 mL IVPB  Status:  Discontinued     20 mg/kg  52.6 kg166.7 mL/hr over 90 Minutes Intravenous  Once 11/12/14 1936 11/12/14 1948    11/12/14 1945  piperacillin-tazobactam (ZOSYN) IVPB 3.375 g     3.375 g100 mL/hr over 30 Minutes Intravenous  Once 11/12/14 1936 11/12/14 2122     Assessment: 89 yof presented with SOB and respiratory distress from ALF. To start empiric broad-spectrum antibiotics with vancomycin and zosyn. Tmax is 100.9, WBC is WNL and Scr is elevated at 1.85. EDP ordered first doses.   Vanc 1/19>> Zosyn 1/19>>  Goal of Therapy:  Vancomycin trough level 15-20 mcg/ml  Plan:  1. Vancomycin 750mg  IV Q48H 2. Zosyn 2.25gm IV Q6H 3. F/u renal fxn, C&S, clinical status and [facility]  Nashae Maudlin, Drake Leachachel Lynn 11/12/2014,9:56 PM

## 2014-11-12 NOTE — ED Provider Notes (Signed)
CSN: [id number]     Arrival date & time 11/12/14  1636 History   First MD Initiated Contact with Patient 11/12/14 1644     Chief Complaint  Patient presents with  . Respiratory Distress     (Consider location/radiation/quality/duration/timing/severity/associated sxs/prior Treatment) Patient is a 81 y.o. female presenting with shortness of breath. The history is provided by the patient. The history is limited by the absence of a caregiver and the condition of the patient. No language interpreter was used.  Shortness of Breath Severity:  Moderate Onset quality:  Sudden Duration:  1 day Timing:  Constant Progression:  Unchanged Chronicity:  New Context: not activity   Relieved by:  None tried Worsened by:  Nothing tried Ineffective treatments:  None tried Associated symptoms: cough   Associated symptoms: no fever and no wheezing     Past Medical History  Diagnosis Date  . Depression   . GERD (gastroesophageal reflux disease)   . Dementia   . CAD (coronary artery disease)   . Cardiomyopathy   . Osteoporosis   . Constipation   . Failure to thrive in childhood   . Arthritis    Past Surgical History  Procedure Laterality Date  . Appendectomy     Family History  Problem Relation Age of Onset  . Alzheimer's disease    . Coronary artery disease     History  Substance Use Topics  . Smoking status: Former Smoker    Quit date: 10/25/2002  . Smokeless tobacco: Never Used  . Alcohol Use: No   OB History    No data available     Review of Systems  Unable to perform ROS: Dementia  Constitutional: Negative for fever.  Respiratory: Positive for cough and shortness of breath. Negative for wheezing.       Allergies  Review of patient's allergies indicates no known allergies.  Home Medications   Prior to Admission medications   Medication Sig Start Date End Date Taking? Authorizing Provider  acetaminophen (TYLENOL) 325 MG tablet Take 650 mg by mouth every 6 (six)  hours as needed (pain).    Yes Historical Provider, MD  albuterol (PROVENTIL HFA;VENTOLIN HFA) 108 (90 BASE) MCG/ACT inhaler Inhale 2 puffs into the lungs 3 (three) times daily. 6am, 12 noon and 6pm   Yes Historical Provider, MD  aspirin 81 MG chewable tablet Chew 81 mg by mouth daily at 6 (six) AM.    Yes Historical Provider, MD  benzonatate (TESSALON) 200 MG capsule Take 200 mg by mouth 3 (three) times daily as needed for cough.   Yes Historical Provider, MD  donepezil (ARICEPT) 5 MG tablet Take 5 mg by mouth at bedtime.   Yes Historical Provider, MD  furosemide (LASIX) 80 MG tablet Take 80 mg by mouth daily.   Yes Historical Provider, MD  haloperidol (HALDOL) 1 MG tablet Take 1 mg by mouth at bedtime as needed for agitation.    Yes Historical Provider, MD  losartan (COZAAR) 50 MG tablet Take 50 mg by mouth daily at 6 (six) AM.    Yes Historical Provider, MD  nystatin (MYCOSTATIN/NYSTOP) 100000 UNIT/GM POWD Apply topically 2 (two) times daily. 6am and 6pm - apply under breasts 09/06/13  Yes Historical Provider, MD  omeprazole (PRILOSEC) 20 MG capsule Take 20 mg by mouth daily at 6 (six) AM.    Yes Historical Provider, MD  potassium chloride SA (K-DUR,KLOR-CON) 20 MEQ tablet Take 20 mEq by mouth daily.    Yes Historical Provider, MD  Protein (  PROCEL) POWD Take 2 scoop by mouth 3 (three) times daily. Mix 2 scoops in 4 oz of milk/juice and drink at 8am, 2pm and 8pm   Yes Historical Provider, MD  Psyllium (METAMUCIL PO) Take 1 scoop by mouth daily at 6 (six) AM. Mix in liquid and drink - for gas   Yes Historical Provider, MD  raloxifene (EVISTA) 60 MG tablet Take 60 mg by mouth daily at 6 (six) AM.    Yes Historical Provider, MD  sertraline (ZOLOFT) 25 MG tablet Take 25 mg by mouth at bedtime.   Yes Historical Provider, MD  triamcinolone cream (KENALOG) 0.1 % Apply 1 application topically 3 (three) times daily.   Yes Historical Provider, MD  cephALEXin (KEFLEX) 500 MG capsule Take 1 capsule (500 mg  total) by mouth 4 (four) times daily. Patient not taking: Reported on 11/12/2014 12/28/13   Gwyneth Sprout, MD   BP 144/66 mmHg  Pulse 96  Temp(Src) 98.3 F (36.8 C) (Oral)  Resp 18  Ht 5' 4.17" (1.63 m)  Wt 116 lb (52.617 kg)  BMI 19.80 kg/m2  SpO2 98% Physical Exam  Constitutional: She is oriented to person, place, and time. She appears well-developed and well-nourished. No distress.  HENT:  Head: Normocephalic and atraumatic.  Eyes: Pupils are equal, round, and reactive to light.  Neck: Normal range of motion.  Cardiovascular: Normal rate, regular rhythm, normal heart sounds and intact distal pulses.   Pulmonary/Chest: Effort normal. No accessory muscle usage. Tachypnea noted. No respiratory distress. She has decreased breath sounds in the right middle field, the right lower field, the left middle field and the left lower field. She has no wheezes. She exhibits no tenderness.  Abdominal: Soft. Bowel sounds are normal. She exhibits no distension. There is no tenderness. There is no rebound and no guarding.  Neurological: She is alert and oriented to person, place, and time. She has normal strength. No cranial nerve deficit or sensory deficit. She exhibits normal muscle tone. Coordination and gait normal.  Skin: Skin is warm and dry.  Nursing note and vitals reviewed.   ED Course  Procedures (including critical care time) Labs Review Labs Reviewed  CBC WITH DIFFERENTIAL - Abnormal; Notable for the following:    RBC 3.81 (*)    Hemoglobin 11.9 (*)    Platelets 111 (*)    Neutrophils Relative % 87 (*)    Lymphocytes Relative 6 (*)    Lymphs Abs 0.4 (*)    All other components within normal limits  BASIC METABOLIC PANEL - Abnormal; Notable for the following:    Glucose, Bld 165 (*)    BUN 29 (*)    Creatinine, Ser 1.85 (*)    GFR calc non Af Amer 23 (*)    GFR calc Af Amer 26 (*)    All other components within normal limits  TROPONIN I - Abnormal; Notable for the following:     Troponin I 0.06 (*)    All other components within normal limits  BRAIN NATRIURETIC PEPTIDE - Abnormal; Notable for the following:    B Natriuretic Peptide 217.8 (*)    All other components within normal limits  COMPREHENSIVE METABOLIC PANEL - Abnormal; Notable for the following:    Potassium 3.4 (*)    Glucose, Bld 118 (*)    BUN 26 (*)    Creatinine, Ser 1.75 (*)    GFR calc non Af Amer 24 (*)    GFR calc Af Amer 28 (*)    All  other components within normal limits  CBC - Abnormal; Notable for the following:    RBC 3.76 (*)    Hemoglobin 11.6 (*)    HCT 35.1 (*)    Platelets 116 (*)    All other components within normal limits  MRSA PCR SCREENING  URINE CULTURE  CULTURE, BLOOD (ROUTINE X 2)  CULTURE, BLOOD (ROUTINE X 2)  URINALYSIS, ROUTINE W REFLEX MICROSCOPIC  LACTIC ACID, PLASMA    Imaging Review Dg Chest 1 View  11/12/2014   CLINICAL DATA:  Altered mental status and shortness of breath.  EXAM: CHEST - 1 VIEW  COMPARISON:  12/28/2013  FINDINGS: Patient appears to be kyphotic and the jaw overlies the upper chest. New densities at the right costophrenic angle could represent overlying shadows but indeterminate. Slightly prominent peribronchial and interstitial lung markings. Heart size is normal with median sternotomy wires.  IMPRESSION: Limited exam due to patient positioning. Consider follow-up imaging.  Cannot exclude densities at the right costophrenic angle.  Mild peribronchial thickening and cannot exclude mild edema.   Electronically Signed   By: Richarda OverlieAdam  Henn M.D.   On: 11/12/2014 16:58   Dg Chest Port 1 View  11/13/2014   CLINICAL DATA:  Subsequent evaluation for shortness of breath  EXAM: PORTABLE CHEST - 1 VIEW  COMPARISON:  11/12/14  FINDINGS: Study is very limited by rotation. Stable mild cardiac enlargement. Allowing for this the lungs appear clear. Specifically, no significant right lower lobe infiltrate or right pleural effusion is appreciated.  IMPRESSION: Grossly no  acute findings, although the study is significantly limited by patient rotation.   Electronically Signed   By: Esperanza Heiraymond  Rubner M.D.   On: 11/13/2014 08:46     EKG Interpretation   Date/Time:  Tuesday November 12 2014 16:41:04 EST Ventricular Rate:  97 PR Interval:  166 QRS Duration: 140 QT Interval:  440 QTC Calculation: 559 R Axis:   -37 Text Interpretation:  Sinus rhythm Left bundle branch block LBBB old since  previous Confirmed by YAO  MD, DAVID ([id number]) on 11/12/2014 4:49:47 PM      MDM   Final diagnoses:  Shortness of breath  HCAP (healthcare-associated pneumonia)    Patient is a 81 year old Caucasian female with pertinent past medical history of congestive heart failure who comes to the emergency Department today with shortness of breath. Physical exam as above. Per EMS patient had significant shortness of breath upon their arrival.  As a result they placed her on CPAP. Upon arrival patient is breathing comfortably with respiratory rate in the 20s with O2 saturation of 90% on room air.  As a result I do not feel that she requires further BiPAP or CPAP at this time. Patient was removed from CPAP and placed on 2 L nasal cannula. Her oxygen saturations remained between 94 and 100% with respiratory rate in the teens to 20s. Initial differential is concerning for CHF exacerbation.  As a result she was treated with 40 mg of Lasix. The remainder of the workup included a CBC, BMP, troponin, EKG, BNP, and chest x-ray. Patient had a rectal temperature of 100.9 as result a UA, blood cultures 2, and urine culture were obtained. Chest x-ray demonstrated a new density in the right costophrenic angle and bronchial thickening concerning for pulmonary edema versus pneumonia. With the patient's fever this is concerning for pneumonia as result she was treated with vancomycin and Zosyn. Troponin was elevated to 0.06 this is likely secondary to demand.  The patient was treated with aspirin  we will continue  to monitor this as an inpatient. Creatinine was elevated at 1.85 concerning for an AKI. CBC was unremarkable.  UA was unremarkable. Lactic acid is 1.4. Patient was felt to require admission to the hospital for healthcare associated pneumonia versus a CHF exacerbation. She was admitted to the medicine service in good condition. Labs and imaging reviewed by myself and considered in medical decision making. Imaging was interpreted by radiology. Care was discussed with my attending Dr. Silverio Lay.    Bethann Berkshire, MD 11/13/14 1610  Richardean Canal, MD 11/13/14 [id number]

## 2014-11-12 NOTE — ED Notes (Signed)
Per [facility], [facility] and resp distress that started after lunch. Pt was in room and staff had gone to her room and she was having difficulty breathing. Pt with hx of CHF. Rales in lower lungs and on arrival here more on right bases. EMS had tried on NRB but would keep pulling it off so started on CPAP. Pt is alert and oriented to norm per [facility]. 20g to LFA

## 2014-11-13 ENCOUNTER — Observation Stay (HOSPITAL_COMMUNITY): Payer: Medicare Other

## 2014-11-13 DIAGNOSIS — Y95 Nosocomial condition: Secondary | ICD-10-CM | POA: Diagnosis present

## 2014-11-13 DIAGNOSIS — J189 Pneumonia, unspecified organism: Secondary | ICD-10-CM | POA: Diagnosis present

## 2014-11-13 DIAGNOSIS — I129 Hypertensive chronic kidney disease with stage 1 through stage 4 chronic kidney disease, or unspecified chronic kidney disease: Secondary | ICD-10-CM | POA: Diagnosis present

## 2014-11-13 DIAGNOSIS — Z8673 Personal history of transient ischemic attack (TIA), and cerebral infarction without residual deficits: Secondary | ICD-10-CM | POA: Diagnosis not present

## 2014-11-13 DIAGNOSIS — E43 Unspecified severe protein-calorie malnutrition: Secondary | ICD-10-CM | POA: Diagnosis present

## 2014-11-13 DIAGNOSIS — I429 Cardiomyopathy, unspecified: Secondary | ICD-10-CM | POA: Diagnosis present

## 2014-11-13 DIAGNOSIS — M810 Age-related osteoporosis without current pathological fracture: Secondary | ICD-10-CM | POA: Diagnosis present

## 2014-11-13 DIAGNOSIS — I214 Non-ST elevation (NSTEMI) myocardial infarction: Secondary | ICD-10-CM | POA: Diagnosis not present

## 2014-11-13 DIAGNOSIS — Z853 Personal history of malignant neoplasm of breast: Secondary | ICD-10-CM | POA: Diagnosis not present

## 2014-11-13 DIAGNOSIS — M1990 Unspecified osteoarthritis, unspecified site: Secondary | ICD-10-CM | POA: Diagnosis present

## 2014-11-13 DIAGNOSIS — D61818 Other pancytopenia: Secondary | ICD-10-CM | POA: Diagnosis present

## 2014-11-13 DIAGNOSIS — F0390 Unspecified dementia without behavioral disturbance: Secondary | ICD-10-CM | POA: Diagnosis present

## 2014-11-13 DIAGNOSIS — J9691 Respiratory failure, unspecified with hypoxia: Secondary | ICD-10-CM | POA: Diagnosis present

## 2014-11-13 DIAGNOSIS — I509 Heart failure, unspecified: Secondary | ICD-10-CM | POA: Diagnosis present

## 2014-11-13 DIAGNOSIS — Z23 Encounter for immunization: Secondary | ICD-10-CM | POA: Diagnosis not present

## 2014-11-13 DIAGNOSIS — N179 Acute kidney failure, unspecified: Secondary | ICD-10-CM | POA: Diagnosis present

## 2014-11-13 DIAGNOSIS — Z681 Body mass index (BMI) 19 or less, adult: Secondary | ICD-10-CM | POA: Diagnosis not present

## 2014-11-13 DIAGNOSIS — Z87891 Personal history of nicotine dependence: Secondary | ICD-10-CM | POA: Diagnosis not present

## 2014-11-13 DIAGNOSIS — R0602 Shortness of breath: Secondary | ICD-10-CM | POA: Diagnosis present

## 2014-11-13 DIAGNOSIS — E785 Hyperlipidemia, unspecified: Secondary | ICD-10-CM | POA: Diagnosis present

## 2014-11-13 DIAGNOSIS — K219 Gastro-esophageal reflux disease without esophagitis: Secondary | ICD-10-CM | POA: Diagnosis present

## 2014-11-13 DIAGNOSIS — Z87440 Personal history of urinary (tract) infections: Secondary | ICD-10-CM | POA: Diagnosis not present

## 2014-11-13 DIAGNOSIS — Z8249 Family history of ischemic heart disease and other diseases of the circulatory system: Secondary | ICD-10-CM | POA: Diagnosis not present

## 2014-11-13 DIAGNOSIS — F410 Panic disorder [episodic paroxysmal anxiety] without agoraphobia: Secondary | ICD-10-CM | POA: Diagnosis present

## 2014-11-13 DIAGNOSIS — N184 Chronic kidney disease, stage 4 (severe): Secondary | ICD-10-CM | POA: Diagnosis present

## 2014-11-13 DIAGNOSIS — E876 Hypokalemia: Secondary | ICD-10-CM | POA: Diagnosis present

## 2014-11-13 DIAGNOSIS — Z951 Presence of aortocoronary bypass graft: Secondary | ICD-10-CM | POA: Diagnosis not present

## 2014-11-13 DIAGNOSIS — I25810 Atherosclerosis of coronary artery bypass graft(s) without angina pectoris: Secondary | ICD-10-CM | POA: Diagnosis present

## 2014-11-13 DIAGNOSIS — Z820 Family history of epilepsy and other diseases of the nervous system: Secondary | ICD-10-CM | POA: Diagnosis not present

## 2014-11-13 DIAGNOSIS — Z66 Do not resuscitate: Secondary | ICD-10-CM | POA: Diagnosis present

## 2014-11-13 LAB — COMPREHENSIVE METABOLIC PANEL
ALBUMIN: 3.8 g/dL (ref 3.5–5.2)
ALK PHOS: 59 U/L (ref 39–117)
ALT: 10 U/L (ref 0–35)
ANION GAP: 12 (ref 5–15)
AST: 22 U/L (ref 0–37)
BUN: 26 mg/dL — ABNORMAL HIGH (ref 6–23)
CO2: 27 mmol/L (ref 19–32)
Calcium: 8.6 mg/dL (ref 8.4–10.5)
Chloride: 103 mEq/L (ref 96–112)
Creatinine, Ser: 1.75 mg/dL — ABNORMAL HIGH (ref 0.50–1.10)
GFR calc Af Amer: 28 mL/min — ABNORMAL LOW (ref >90)
GFR calc non Af Amer: 24 mL/min — ABNORMAL LOW (ref >90)
Glucose, Bld: 118 mg/dL — ABNORMAL HIGH (ref 70–99)
POTASSIUM: 3.4 mmol/L — AB (ref 3.5–5.1)
SODIUM: 142 mmol/L (ref 135–145)
Total Bilirubin: 1 mg/dL (ref 0.3–1.2)
Total Protein: 6.6 g/dL (ref 6.0–8.3)

## 2014-11-13 LAB — CBC
HCT: 35.1 % — ABNORMAL LOW (ref 36.0–46.0)
Hemoglobin: 11.6 g/dL — ABNORMAL LOW (ref 12.0–15.0)
MCH: 30.9 pg (ref 26.0–34.0)
MCHC: 33 g/dL (ref 30.0–36.0)
MCV: 93.4 fL (ref 78.0–100.0)
Platelets: 116 10*3/uL — ABNORMAL LOW (ref 150–400)
RBC: 3.76 MIL/uL — ABNORMAL LOW (ref 3.87–5.11)
RDW: 14.1 % (ref 11.5–15.5)
WBC: 6.3 10*3/uL (ref 4.0–10.5)

## 2014-11-13 LAB — MRSA PCR SCREENING: MRSA BY PCR: NEGATIVE

## 2014-11-13 MED ORDER — IPRATROPIUM-ALBUTEROL 0.5-2.5 (3) MG/3ML IN SOLN: 3.0000 mL | RESPIRATORY_TRACT | Status: DC | PRN

## 2014-11-13 MED ORDER — IPRATROPIUM-ALBUTEROL 0.5-2.5 (3) MG/3ML IN SOLN: 3.0000 mL | Freq: Four times a day (QID) | RESPIRATORY_TRACT | Status: DC

## 2014-11-13 MED ORDER — ALBUTEROL SULFATE (2.5 MG/3ML) 0.083% IN NEBU
3.0000 mL | INHALATION_SOLUTION | Freq: Four times a day (QID) | RESPIRATORY_TRACT | Status: DC | PRN
Start: 2014-11-13 — End: 2014-11-13

## 2014-11-13 NOTE — Progress Notes (Signed)
[name] [id number] Code Status: DNR   Admission Data: 11/13/2014 1:14 AM Attending Provider:  Dr. Russo WUJ:WJXBJ,YNWGPCP:RUSSO,JOHN M, MD Consults/ Treatment Team:    Katie SacksMary A Heather is a 81 y.o. female patient admitted from ED awake, alert - oriented  X 3 - no acute distress noted.  VSS - Blood pressure 128/46, pulse 88, temperature 97.7 F (36.5 C), temperature source Oral, resp. rate 22, height 5' 4.17" (1.63 m), weight 52.617 kg (116 lb), SpO2 100 %.    IV in place, occlusive dsg intact without redness.  Orientation to room, and floor completed with information packet given to patient/family.  Patient declined safety video at this time.  Admission INP armband ID verified with patient/family, and in place.   SR up x 2, fall assessment complete, with patient and family able to verbalize understanding of risk associated with falls, and verbalized understanding to call nsg before up out of bed.  Call light within reach, patient able to voice, and demonstrate understanding.  Skin, pt has redness on sacrum and back, scabs on both legs, per pt family both legs weeping pta.  Moisture associated skin damage to right breast.    Will cont to eval and treat per MD orders.  Sharilyn SitesJeter, Velora Horstman M, RN 11/13/2014 1:14 AM

## 2014-11-13 NOTE — Clinical Social Work Note (Signed)
Clinical Social Work Department BRIEF PSYCHOSOCIAL ASSESSMENT 11/13/2014  Patient:  [name], [name]     Account Number:  [id number]     Admit date:  11/12/2014  Clinical Social Worker:  Myles Lipps  Date/Time:  11/13/2014 02:30 PM  Referred by:  Physician  Date Referred:  11/13/2014 Referred for  ALF Placement   Other Referral:   Interview type:  Family Other interview type:   Spoke with patient daughter at bedside    PSYCHOSOCIAL DATA Living Status:  FACILITY Admitted from facility:   Level of care:  Assisted Living Primary support name:  Loletta Specter  [phone] Primary support relationship to patient:  CHILD, ADULT Degree of support available:   Strong    CURRENT CONCERNS Current Concerns  Post-Acute Placement   Other Concerns:    SOCIAL WORK ASSESSMENT / PLAN Clinical Social Worker met with patient and patient daughter, Jeronimo Greaves, at bedside to offer support and discuss patient needs at discharge.  Patient daughter states that patient has been a resident at [facility]. since 2010.  Patient daughter states that patient will likely return to [place] with home health.  CSW addressed the potential possibility for SNF placement - patient daughter states that if that were to have to happen, it would need to be discussed with patient other daughter, Heriberto Antigua.  CSW states that we will await PT/OT evaluations and communicate with facility in order to make every attempt for patient return to [place].  CSW to contact facility once patient is able to work with therapies.  CSW remains available for support and to facilitate patient discharge needs once medically stable.   Assessment/plan status:  Psychosocial Support/Ongoing Assessment of Needs Other assessment/ plan:   Information/referral to community resources:   Holiday representative offered SNF option, however patient has been a resident for almost 6 years and does not do well with change in environment.  Patient  daughter feels certain patient will be able to return at discharge.    PATIENT'S/FAMILY'S RESPONSE TO PLAN OF CARE: Patient alert and oriented to self at this time.  Patient daughter at bedside and remains at bedside for patient support.  Per patient daughter, patient doing well at [facility] and views this as "home."  Patient daughter verbalized understanding of CSW role and appreciation for support and concern.

## 2014-11-13 NOTE — Progress Notes (Signed)
Subjective: Admitted 1/19 c Hypoxic Resp Failure - requiring FIO2/NRB/CPAP/BiPAP CXR c/w PNA - Broad Spectrum Abx given Tmax 100.9 ? Flash Pulm Edema as well and IV Lasix given.  She improved some after that. Breathing well this am.  Looks better than expected. I discussed case c daughter Hazle Nordmann- Belvia @ [phone]. She c/o of being cold - so I covered her c her sheets/covers then she complained that she did not want to be covered.  She recognized me but did not know my name.   Denied SOB   Objective: Vital signs in last 24 hours: Temp:  [97.7 F (36.5 C)-100.9 F (38.3 C)] 98.3 F (36.8 C) (01/20 0616) Pulse Rate:  [74-100] 96 (01/20 0616) Resp:  [17-30] 18 (01/20 0616) BP: (109-144)/(46-83) 144/66 mmHg (01/20 0616) SpO2:  [97 %-100 %] 98 % (01/20 0756) Weight:  [52.617 kg (116 lb)] 52.617 kg (116 lb) (01/19 1643) Weight change:  Last BM Date: 11/12/14  CBG (last 3)  No results for input(s): GLUCAP in the last 72 hours.  Intake/Output from previous day:  Intake/Output Summary (Last 24 hours) at 11/13/14 0842 Last data filed at 11/13/14 0700  Gross per 24 hour  Intake 505.83 ml  Output    520 ml  Net -14.17 ml   01/19 0701 - 01/20 0700 In: 505.8 [P.O.:90; I.V.:365.8; IV Piggyback:50] Out: 520 [Urine:520]   Physical Exam  General appearance: A and O Eyes: no scleral icterus Throat: oropharynx moist without erythema Resp: + Rhonchi Cardio: Reg c + m GI: soft, non-tender; bowel sounds normal; no masses,  no organomegaly Extremities: decreased edema Lots of OA issues.   Lab Results:  Recent Labs  11/12/14 1715 11/13/14 0557  NA 136 142  K 4.1 3.4*  CL 101 103  CO2 25 27  GLUCOSE 165* 118*  BUN 29* 26*  CREATININE 1.85* 1.75*  CALCIUM 8.8 8.6     Recent Labs  11/13/14 0557  AST 22  ALT 10  ALKPHOS 59  BILITOT 1.0  PROT 6.6  ALBUMIN 3.8     Recent Labs  11/12/14 1715 11/13/14 0557  WBC 7.1 6.3  NEUTROABS 6.1  --   HGB 11.9* 11.6*  HCT 36.7  35.1*  MCV 96.3 93.4  PLT 111* 116*    No results found for: INR, PROTIME   Recent Labs  11/12/14 1715  TROPONINI 0.06*    No results for input(s): TSH, T4TOTAL, T3FREE, THYROIDAB in the last 72 hours.  Invalid input(s): FREET3  No results for input(s): VITAMINB12, FOLATE, FERRITIN, TIBC, IRON, RETICCTPCT in the last 72 hours.  Micro Results: Recent Results (from the past 240 hour(s))  MRSA PCR Screening     Status: None   Collection Time: 11/12/14 10:42 PM  Result Value Ref Range Status   MRSA by PCR NEGATIVE NEGATIVE Final    Comment:        The GeneXpert MRSA Assay (FDA approved for NASAL specimens only), is one component of a comprehensive MRSA colonization surveillance program. It is not intended to diagnose MRSA infection nor to guide or monitor treatment for MRSA infections.      Studies/Results: Dg Chest 1 View  11/12/2014   CLINICAL DATA:  Altered mental status and shortness of breath.  EXAM: CHEST - 1 VIEW  COMPARISON:  12/28/2013  FINDINGS: Patient appears to be kyphotic and the jaw overlies the upper chest. New densities at the right costophrenic angle could represent overlying shadows but indeterminate. Slightly prominent peribronchial and interstitial lung markings.  Heart size is normal with median sternotomy wires.  IMPRESSION: Limited exam due to patient positioning. Consider follow-up imaging.  Cannot exclude densities at the right costophrenic angle.  Mild peribronchial thickening and cannot exclude mild edema.   Electronically Signed   By: Richarda Overlie M.D.   On: 11/12/2014 16:58     Medications: Scheduled: . aspirin  81 mg Oral Q0600  . donepezil  5 mg Oral QHS  . enoxaparin (LOVENOX) injection  30 mg Subcutaneous Q24H  . ipratropium-albuterol  3 mL Nebulization Q6H  . losartan  50 mg Oral Q0600  . nystatin   Topical Q12H  . pantoprazole  40 mg Oral Daily  . piperacillin-tazobactam (ZOSYN)  IV  2.25 g Intravenous Q6H  . pneumococcal 23 valent  vaccine  0.5 mL Intramuscular Tomorrow-1000  . protein supplement  2 scoop Oral 3 times per day  . psyllium  1 packet Oral Daily  . sertraline  25 mg Oral QHS  . [START ON 11/14/2014] vancomycin  750 mg Intravenous Q48H   Continuous: . sodium chloride 50 mL/hr at 11/12/14 2341     Assessment/Plan: Active Problems:   Healthcare-associated pneumonia  Hypoxic Resp Failure secondary to HCAP - Broad Spectrum Abx.  Off CPAP/BiPAP - Continue FIO2, Pulm Toilet and time.  ? Aspiration c Dementia and age.  Will treat and may get ST only if needed..  WBC only 6.3 - 7.1.  No Lactic Acidosis.  Repeat CXR today.  Add Nebs/Flutter Valve/IS.  Await Cxs.  If gets sicker and appears terminal family would be receptive to hospice palliative care  CAD/CABG/CHF - S/P Lasix.  BNP was only 217.  Recheck tom am.  Last EF 40-45 percent from echocardiogram 2007.  Repeating ECHO at this point does not seem necessary  NSTEMI c Trop I 0.06.  NTD.  Demand.  Follow.  OK for lovenox and not Hep gtt  Protein Cal Malnutrition - Nutrition consult  Chronic LE Edmea - Stockings and diuretics as able  CKD 4/AKI cr stable at 1.75 - 1.85  Mild Anemia - Hbg 11.6 - 11.9 Thrombocytopenia - Stable  Dementia and lives ALF.  DNR.  On Meds. Gets panic attacks easily and hallucinations.  Saw cats and dogs in ED.  She is currently pleasant and comfortable.  DVT proph  Skin issues - Keep C/D/I  DNR  ID -  Anti-infectives    Start     Dose/Rate Route Frequency Ordered Stop   11/14/14 2200  vancomycin (VANCOCIN) IVPB 750 mg/150 ml premix     750 mg150 mL/hr over 60 Minutes Intravenous Every 48 hours 11/12/14 2156     11/13/14 0300  piperacillin-tazobactam (ZOSYN) IVPB 2.25 g     2.25 g100 mL/hr over 30 Minutes Intravenous Every 6 hours 11/12/14 2156     11/12/14 2000  vancomycin (VANCOCIN) IVPB 1000 mg/200 mL premix     1,000 mg200 mL/hr over 60 Minutes Intravenous  Once 11/12/14 1948 11/12/14 2224   11/12/14 1945   vancomycin (VANCOCIN) 1,052 mg in sodium chloride 0.9 % 250 mL IVPB  Status:  Discontinued     20 mg/kg  52.6 kg166.7 mL/hr over 90 Minutes Intravenous  Once 11/12/14 1936 11/12/14 1948   11/12/14 1945  piperacillin-tazobactam (ZOSYN) IVPB 3.375 g     3.375 g100 mL/hr over 30 Minutes Intravenous  Once 11/12/14 1936 11/12/14 2122        LOS: 1 day   Najmah Carradine M 11/13/2014, 8:42 AM

## 2014-11-13 NOTE — Progress Notes (Signed)
11/13/2014 1220 UR complete. Chart reviewed. Isidoro DonningAlesia Maribella Kuna RN CCM Case Mgmt phone [phone]

## 2014-11-13 NOTE — Progress Notes (Signed)
INITIAL NUTRITION ASSESSMENT  DOCUMENTATION CODES Per approved criteria  -Severe malnutrition in the context of acute illness or injury   Pt meets criteria for severe MALNUTRITION in the context of acute illness as evidenced by moderate fat and muscle depletion.  INTERVENTION: -Magic Cup TID  NUTRITION DIAGNOSIS: Inadequate oral intake related to decreased appetite as evidenced by PO: 25%.   Goal: Pt will meet >90% of estimated nutritional needs  Monitor:  PO/supplement intake, labs, weight changes, I/O's  Reason for Assessment: MST=2  81 y.o. female  Admitting Dx: <principal problem not specified>  ASSESSMENT: Pt admitted with HCAP and acute respiratory failure.  Hx obtained by pt and pt daughter at bedside. Pt daughter reports that pt has had a poor appetite for the past few weeks. Daughter reports intake has been declining over the past few weeks, due to menu revisions at the ALF. She also reports wt loss, due to a combination of diuretics and poor intake. Lowest body weight was 113# over the past month. Daughter reports that MD at facility would like for pt to continue to be in the 113-116# range. She also shares that pt has a hx of malnutrition. Intake has been poor since admission; PO: 25%. Per nurse tech, pt has not been eating much and has taken a few sips of various supplements. Daughter reports supplements were d/c at the facility because pt wasn't drinking them. She is amenable to [employer], as pt enjoys ice cream.  Labs reviewed. K: 3.4, BUN/Creat: 26/1.75, Glucose: 118.   Nutrition Focused Physical Exam:  Subcutaneous Fat:  Orbital Region: mild depletion Upper Arm Region: mild depletion Thoracic and Lumbar Region: WDL  Muscle:  Temple Region: mild depletion Clavicle Bone Region: severe depletion Clavicle and Acromion Bone Region: moderate depletion Scapular Bone Region: moderate depletion Dorsal Hand: severe depletion Patellar Region: WDL Anterior Thigh  Region: WDL Posterior Calf Region: moderate depletion  Edema: none present    Height: Ht Readings from Last 1 Encounters:  11/12/14 5' 4.17" (1.63 m)    Weight: Wt Readings from Last 1 Encounters:  11/12/14 116 lb (52.617 kg)    Ideal Body Weight: 120#  % Ideal Body Weight: 97%  Wt Readings from Last 10 Encounters:  11/12/14 116 lb (52.617 kg)  12/07/12 121 lb (54.885 kg)    Usual Body Weight: 115#  % Usual Body Weight: 101%  BMI:  Body mass index is 19.8 kg/(m^2). Normal weight  Estimated Nutritional Needs: Kcal: 1500-1700 Protein: 63-73 grams Fluid: 1.5-1.7 L  Skin: MASD on buttocks and back   Diet Order: Diet regular  EDUCATION NEEDS: -Education needs addressed   Intake/Output Summary (Last 24 hours) at 11/13/14 1513 Last data filed at 11/13/14 1354  Gross per 24 hour  Intake 745.83 ml  Output    720 ml  Net  25.83 ml    Last BM: 11/12/14  Labs:   Recent Labs Lab 11/12/14 1715 11/13/14 0557  NA 136 142  K 4.1 3.4*  CL 101 103  CO2 25 27  BUN 29* 26*  CREATININE 1.85* 1.75*  CALCIUM 8.8 8.6  GLUCOSE 165* 118*    CBG (last 3)  No results for input(s): GLUCAP in the last 72 hours.  Scheduled Meds: . aspirin  81 mg Oral Q0600  . donepezil  5 mg Oral QHS  . enoxaparin (LOVENOX) injection  30 mg Subcutaneous Q24H  . losartan  50 mg Oral Q0600  . nystatin   Topical Q12H  . pantoprazole  40  mg Oral Daily  . piperacillin-tazobactam (ZOSYN)  IV  2.25 g Intravenous Q6H  . protein supplement  2 scoop Oral 3 times per day  . psyllium  1 packet Oral Daily  . sertraline  25 mg Oral QHS  . [START ON 11/14/2014] vancomycin  750 mg Intravenous Q48H    Continuous Infusions: . sodium chloride 20 mL/hr at 11/13/14 1105    Past Medical History  Diagnosis Date  . Depression   . GERD (gastroesophageal reflux disease)   . Dementia   . CAD (coronary artery disease)   . Cardiomyopathy   . Osteoporosis   . Constipation   . Failure to thrive in  childhood   . Arthritis     Past Surgical History  Procedure Laterality Date  . Appendectomy      Jalexus Brett A. Mayford KnifeWilliams, RD, LDN, CDE Pager: [phone] After hours Pager: [phone]

## 2014-11-14 ENCOUNTER — Inpatient Hospital Stay (HOSPITAL_COMMUNITY): Payer: Medicare Other

## 2014-11-14 DIAGNOSIS — E43 Unspecified severe protein-calorie malnutrition: Secondary | ICD-10-CM | POA: Insufficient documentation

## 2014-11-14 LAB — BRAIN NATRIURETIC PEPTIDE: B Natriuretic Peptide: 372.1 pg/mL — ABNORMAL HIGH (ref 0.0–100.0)

## 2014-11-14 LAB — URINE CULTURE
COLONY COUNT: NO GROWTH
CULTURE: NO GROWTH

## 2014-11-14 LAB — CBC
HEMATOCRIT: 33.3 % — AB (ref 36.0–46.0)
HEMOGLOBIN: 10.9 g/dL — AB (ref 12.0–15.0)
MCH: 31.5 pg (ref 26.0–34.0)
MCHC: 32.7 g/dL (ref 30.0–36.0)
MCV: 96.2 fL (ref 78.0–100.0)
Platelets: 104 10*3/uL — ABNORMAL LOW (ref 150–400)
RBC: 3.46 MIL/uL — AB (ref 3.87–5.11)
RDW: 14.2 % (ref 11.5–15.5)
WBC: 4.7 10*3/uL (ref 4.0–10.5)

## 2014-11-14 LAB — COMPREHENSIVE METABOLIC PANEL
ALBUMIN: 3.5 g/dL (ref 3.5–5.2)
ALK PHOS: 49 U/L (ref 39–117)
ALT: 9 U/L (ref 0–35)
AST: 19 U/L (ref 0–37)
Anion gap: 9 (ref 5–15)
BILIRUBIN TOTAL: 0.9 mg/dL (ref 0.3–1.2)
BUN: 20 mg/dL (ref 6–23)
CHLORIDE: 105 meq/L (ref 96–112)
CO2: 26 mmol/L (ref 19–32)
CREATININE: 1.75 mg/dL — AB (ref 0.50–1.10)
Calcium: 8.8 mg/dL (ref 8.4–10.5)
GFR, EST AFRICAN AMERICAN: 28 mL/min — AB (ref >90)
GFR, EST NON AFRICAN AMERICAN: 24 mL/min — AB (ref >90)
GLUCOSE: 128 mg/dL — AB (ref 70–99)
Potassium: 3.4 mmol/L — ABNORMAL LOW (ref 3.5–5.1)
Sodium: 140 mmol/L (ref 135–145)
Total Protein: 6.6 g/dL (ref 6.0–8.3)

## 2014-11-14 LAB — TROPONIN I: Troponin I: 0.06 ng/mL — ABNORMAL HIGH (ref <0.031)

## 2014-11-14 MED ORDER — CEFUROXIME AXETIL 500 MG PO TABS
500.0000 mg | ORAL_TABLET | Freq: Two times a day (BID) | ORAL | Status: DC
  Administered 2014-11-14 – 2014-11-18 (×9): 500 mg via ORAL
  Filled 2014-11-14 (×11): qty 1

## 2014-11-14 MED ORDER — POTASSIUM CHLORIDE CRYS ER 20 MEQ PO TBCR
20.0000 meq | EXTENDED_RELEASE_TABLET | Freq: Two times a day (BID) | ORAL | Status: DC
  Administered 2014-11-14 (×2): 20 meq via ORAL
  Filled 2014-11-14 (×4): qty 1

## 2014-11-14 MED ORDER — FUROSEMIDE 40 MG PO TABS
40.0000 mg | ORAL_TABLET | Freq: Every day | ORAL | Status: DC
  Filled 2014-11-14: qty 1

## 2014-11-14 MED ORDER — FUROSEMIDE 20 MG PO TABS
20.0000 mg | ORAL_TABLET | Freq: Every day | ORAL | Status: DC
  Administered 2014-11-14 – 2014-11-18 (×5): 20 mg via ORAL
  Filled 2014-11-14 (×5): qty 1

## 2014-11-14 NOTE — Care Management Note (Addendum)
    Page 1 of 1   11/18/2014     2:17:03 PM CARE MANAGEMENT NOTE 11/18/2014  Patient:  [name],[name]   Account Number:  [id number]  Date Initiated:  11/13/2014  Documentation initiated by:  [facility]ALESIA  Subjective/Objective Assessment:   PNA  admit- from [facility]     Action/Plan:   pt eval- hhpt/ot.   Anticipated DC Date:  11/11/2014   Anticipated DC Plan:  ASSISTED LIVING / REST HOME  In-house referral  Clinical Social Worker      DC Planning Services  CM consult      Choice offered to / List presented to:             Status of service:  Completed, signed off Medicare Important Message given?  YES (If response is "NO", the following Medicare IM given date fields will be blank) Date Medicare IM given:  11/14/2014 Medicare IM given by:  [name],[name] Date Additional Medicare IM given:  11/18/2014 Additional Medicare IM given by:  [name]  Discharge Disposition:  ASSISTED LIVING  Per UR Regulation:  Reviewed for med. necessity/level of care/duration of stay  If discussed at Long Length of Stay Meetings, dates discussed:    Comments:  11/18/14 1414 Katie Capeeborah Dinorah Masullo RN, BSN [phone] patient is for dc to ALF today.  11/15/14 1025 1026 Katie Capeeborah Ellerie Arenz RN, BSN [phone] patient for dc today to ALF, orders for pt/ot and face to face  put on chart to go into patients packet.  11/14/14 1424 Katie Capeeborah Danie Diehl RN BSN [phone] patient is [facility] per Delice Bisonara [phone].  She states they will just need dc summary faxed to  [phone], this information was given to CSW.  11/13/2014 1220 UR complete. Chart reviewed. Katie DonningAlesia Shavis RN CCM Case Mgmt phone [phone]

## 2014-11-14 NOTE — Progress Notes (Signed)
Physical Therapy Evaluation Patient Details Name: [name] MRN: [id number] DOB: [birth year] Today's Date: 11/14/2014   History of Present Illness  81 year old female who resides at assisted living, supported by local children, including a daughter and son who are present currently in the emergency room, past medical history significant for moderate dementia, needs assistance with most of her ADLs, does have she is significant short-term memory impairment but does ability to converse and recognize her children. She does have a history of hypertension coronary artery disease status post bypass surgery cardiomyopathy congestive heart failure left bundle branch block remote rest cancer osteoporosis, reflux, early to moderate dementia with sundowning confusion and delirium adult failure to thrive and bilateral lower extremity edema as well as depression. She does walk independently with a walker and can feed herself. She has a DO NOT RESUSCITATE CODE STATUS. Shortly after lunch, at the assisted-living facility she was found to have rest or distress increased work of breathing, EMS was called, initially placed her on a see Pap machine or BiPAP machine, given significant hypoxia, transported here to the emergency room.  Clinical Impression  Pt admitted with/for hypoxia due to PNA and potentially flash pulmonary edema.  Pt currently limited functionally due to the problems listed below.  (see problems list.)  Pt will benefit from PT to maximize function and safety to be able to get home safely with available assist of caregivers or family.     Follow Up Recommendations Home health PT;Supervision for mobility/OOB    Equipment Recommendations  None recommended by PT    Recommendations for Other Services       Precautions / Restrictions        Mobility  Bed Mobility Overal bed mobility: Needs Assistance Bed Mobility: Supine to Sit     Supine to sit: Min assist     General bed mobility  comments: able to scoot to EOB using bil fists to push  Transfers Overall transfer level: Needs assistance   Transfers: Sit to/from Stand Sit to Stand: Min assist         General transfer comment: cues to push up.  Ambulation/Gait Ambulation/Gait assistance: Min assist Ambulation Distance (Feet): 100 Feet Assistive device: 1 person hand held assist Gait Pattern/deviations: Step-through pattern;Decreased step length - right;Decreased step length - left;Decreased stride length Gait velocity: slow   General Gait Details: small steps with minimal clearance.  Generally stiff overall.  Stairs            Wheelchair Mobility    Modified Rankin (Stroke Patients Only)       Balance Overall balance assessment: Needs assistance Sitting-balance support: No upper extremity supported Sitting balance-Leahy Scale: Fair     Standing balance support: Single extremity supported Standing balance-Leahy Scale: Poor                               Pertinent Vitals/Pain Pain Assessment: Faces Faces Pain Scale: Hurts little more Pain Location: knees, joints Pain Intervention(s): Monitored during session;Limited activity within patient's tolerance    Home Living Family/patient expects to be discharged to:: Assisted living               Home Equipment: Walker - 2 wheels      Prior Function Level of Independence: Needs assistance   Gait / Transfers Assistance Needed: Walks with RW to dining hall and in room without supervision or assistance per daughter.  ADL's / Homemaking Assistance Needed: assisted  by staff.        Hand Dominance        Extremity/Trunk Assessment   Upper Extremity Assessment: Generalized weakness;RUE deficits/detail;LUE deficits/detail RUE Deficits / Details: Joints disfigured and hands limited in function     LUE Deficits / Details: Joints disfigured and hands limited in function   Lower Extremity Assessment: Generalized  weakness;RLE deficits/detail;LLE deficits/detail RLE Deficits / Details: stiff from arthritis, generally weak and functional for ambulation LLE Deficits / Details: generally weak, but functional for gait.     Communication   Communication: Other (comment);No difficulties  Cognition Arousal/Alertness: Awake/alert Behavior During Therapy: WFL for tasks assessed/performed;Flat affect Overall Cognitive Status: History of cognitive impairments - at baseline                      General Comments      Exercises        Assessment/Plan    PT Assessment Patient needs continued PT services  PT Diagnosis Difficulty walking;Generalized weakness   PT Problem List Decreased strength;Decreased range of motion;Decreased activity tolerance;Decreased balance;Decreased mobility;Decreased knowledge of use of DME  PT Treatment Interventions DME instruction;Gait training;Functional mobility training;Therapeutic activities;Balance training;Patient/family education   PT Goals (Current goals can be found in the Care Plan section) Acute Rehab PT Goals Patient Stated Goal: none stated. PT Goal Formulation: Patient unable to participate in goal setting Time For Goal Achievement: 11/21/14 Potential to Achieve Goals: Good    Frequency Min 3X/week   Barriers to discharge        Co-evaluation               End of Session   Activity Tolerance: Patient tolerated treatment well Patient left: in chair;with call bell/phone within reach;with family/visitor present Nurse Communication: Mobility status         Time: [id number] PT Time Calculation (min) (ACUTE ONLY): 27 min   Charges:   PT Evaluation $Initial PT Evaluation Tier I: 1 Procedure PT Treatments $Gait Training: 8-22 mins   PT G Codes:        Okema Rollinson, Eliseo GumKenneth V 11/14/2014, 2:43 PM 11/14/2014  Galva BingKen Verneda Hollopeter, PT [phone]  (pager)

## 2014-11-14 NOTE — Clinical Social Work Note (Signed)
CSW received call from [facility]endy with [facility] Toniann FailWendy has received fax with initial referral (H&P, FL2, progress notes etc.). CSW will continue to update facility on patient's condition. Facility states that if patient requires breathing treatment they would ask for nebulizer to [facility]. CSW will continue to follow.   Roddie McBryant Caden Fukushima MSW, HatfieldLCSWA, RobardsLCASA, [phone]

## 2014-11-14 NOTE — Progress Notes (Signed)
Subjective: Admitted 1/19 c Hypoxic Resp Failure - requiring FIO2/NRB/CPAP/BiPAP Original CXR and Temp up to 100.9 c/w PNA - Broad Spectrum Abx given.  No recent fevers/WBC issues or CXR findings - Will de-escalate ABx today. O2 is off and on the floor this am and she is not SOB ? Flash Pulm Edema as improvement occurred post IV Lasix given.   Breathing well this am.  Looks better than expected. I discussed case c daughter yesterday Hazle Nordmann- Belvia @ [phone].   Objective: Vital signs in last 24 hours: Temp:  [99.1 F (37.3 C)-100 F (37.8 C)] 100 F (37.8 C) (01/21 0552) Pulse Rate:  [85-100] 98 (01/21 0552) Resp:  [18-20] 18 (01/21 0552) BP: (122-159)/(61-68) 159/64 mmHg (01/21 0552) SpO2:  [97 %-100 %] 97 % (01/21 0552) Weight:  [50.6 kg (111 lb 8.8 oz)] 50.6 kg (111 lb 8.8 oz) (01/21 0500) Weight change: -2.017 kg (-4 lb 7.2 oz) Last BM Date: 11/13/14  CBG (last 3)  No results for input(s): GLUCAP in the last 72 hours.  Intake/Output from previous day:  Intake/Output Summary (Last 24 hours) at 11/14/14 0749 Last data filed at 11/13/14 2328  Gross per 24 hour  Intake 1042.5 ml  Output    501 ml  Net  541.5 ml   01/20 0701 - 01/21 0700 In: 1042.5 [P.O.:600; I.V.:342.5; IV Piggyback:100] Out: 501 [Urine:501]   Physical Exam  General appearance: Alert - Not oriented. Eyes: no scleral icterus Throat: oropharynx moist without erythema Resp: + Rhonchi Cardio: Reg c + m GI: soft, non-tender; bowel sounds normal; no masses,  no organomegaly Extremities: decreased edema Lots of OA issues.   Lab Results:  Recent Labs  11/13/14 0557 11/14/14 0536  NA 142 140  K 3.4* 3.4*  CL 103 105  CO2 27 26  GLUCOSE 118* 128*  BUN 26* 20  CREATININE 1.75* 1.75*  CALCIUM 8.6 8.8     Recent Labs  11/13/14 0557 11/14/14 0536  AST 22 19  ALT 10 9  ALKPHOS 59 49  BILITOT 1.0 0.9  PROT 6.6 6.6  ALBUMIN 3.8 3.5     Recent Labs  11/12/14 1715 11/13/14 0557  11/14/14 0536  WBC 7.1 6.3 4.7  NEUTROABS 6.1  --   --   HGB 11.9* 11.6* 10.9*  HCT 36.7 35.1* 33.3*  MCV 96.3 93.4 96.2  PLT 111* 116* 104*    No results found for: INR, PROTIME   Recent Labs  11/12/14 1715 11/14/14 0536  TROPONINI 0.06* 0.06*    No results for input(s): TSH, T4TOTAL, T3FREE, THYROIDAB in the last 72 hours.  Invalid input(s): FREET3  No results for input(s): VITAMINB12, FOLATE, FERRITIN, TIBC, IRON, RETICCTPCT in the last 72 hours.  Micro Results: Recent Results (from the past 240 hour(s))  Blood culture (routine x 2)     Status: None (Preliminary result)   Collection Time: 11/12/14  5:15 PM  Result Value Ref Range Status   Specimen Description BLOOD ARM RIGHT  Final   Special Requests BOTTLES DRAWN AEROBIC AND ANAEROBIC 5CC  Final   Culture   Final           BLOOD CULTURE RECEIVED NO GROWTH TO DATE CULTURE WILL BE HELD FOR 5 DAYS BEFORE ISSUING A FINAL NEGATIVE REPORT Performed at [facility]    Report Status PENDING  Incomplete  Blood culture (routine x 2)     Status: None (Preliminary result)   Collection Time: 11/12/14  5:36 PM  Result Value Ref  Range Status   Specimen Description BLOOD LEFT ARM  Final   Special Requests BOTTLES DRAWN AEROBIC AND ANAEROBIC 5.5 CC  Final   Culture   Final           BLOOD CULTURE RECEIVED NO GROWTH TO DATE CULTURE WILL BE HELD FOR 5 DAYS BEFORE ISSUING A FINAL NEGATIVE REPORT Performed at [facility]    Report Status PENDING  Incomplete  Urine culture     Status: None   Collection Time: 11/12/14  6:38 PM  Result Value Ref Range Status   Specimen Description URINE, CATHETERIZED  Final   Special Requests NONE  Final   Colony Count NO GROWTH Performed at [facility]   Final   Culture NO GROWTH Performed at [facility]   Final   Report Status 11/14/2014 FINAL  Final  MRSA PCR Screening     Status: None   Collection Time: 11/12/14 10:42 PM  Result Value Ref Range Status    MRSA by PCR NEGATIVE NEGATIVE Final    Comment:        The GeneXpert MRSA Assay (FDA approved for NASAL specimens only), is one component of a comprehensive MRSA colonization surveillance program. It is not intended to diagnose MRSA infection nor to guide or monitor treatment for MRSA infections.      Studies/Results: Dg Chest 1 View  11/12/2014   CLINICAL DATA:  Altered mental status and shortness of breath.  EXAM: CHEST - 1 VIEW  COMPARISON:  12/28/2013  FINDINGS: Patient appears to be kyphotic and the jaw overlies the upper chest. New densities at the right costophrenic angle could represent overlying shadows but indeterminate. Slightly prominent peribronchial and interstitial lung markings. Heart size is normal with median sternotomy wires.  IMPRESSION: Limited exam due to patient positioning. Consider follow-up imaging.  Cannot exclude densities at the right costophrenic angle.  Mild peribronchial thickening and cannot exclude mild edema.   Electronically Signed   By: Richarda Overlie M.D.   On: 11/12/2014 16:58   Dg Chest Port 1 View  11/13/2014   CLINICAL DATA:  Subsequent evaluation for shortness of breath  EXAM: PORTABLE CHEST - 1 VIEW  COMPARISON:  11/12/14  FINDINGS: Study is very limited by rotation. Stable mild cardiac enlargement. Allowing for this the lungs appear clear. Specifically, no significant right lower lobe infiltrate or right pleural effusion is appreciated.  IMPRESSION: Grossly no acute findings, although the study is significantly limited by patient rotation.   Electronically Signed   By: Esperanza Heir M.D.   On: 11/13/2014 08:46     Medications: Scheduled: . aspirin  81 mg Oral Q0600  . donepezil  5 mg Oral QHS  . enoxaparin (LOVENOX) injection  30 mg Subcutaneous Q24H  . losartan  50 mg Oral Q0600  . nystatin   Topical Q12H  . pantoprazole  40 mg Oral Daily  . piperacillin-tazobactam (ZOSYN)  IV  2.25 g Intravenous Q6H  . protein supplement  2 scoop Oral 3  times per day  . psyllium  1 packet Oral Daily  . sertraline  25 mg Oral QHS  . vancomycin  750 mg Intravenous Q48H   Continuous: . sodium chloride 20 mL/hr at 11/13/14 6962     Assessment/Plan: Active Problems:   Healthcare-associated pneumonia   Protein-calorie malnutrition, severe  Hypoxic Resp Failure secondary to HCAP (Temps still 99-100) and Flash Pulm Edema.  She will be weaned from Broad Spectrum Abx to Oral Abx today.  Remains off  CPAP/BiPAP -  Wean FIO2.  Conitnue [employer] and time.  ? Aspiration c Dementia and age.  Does not need ST at this time.  WBC only 4.7 - 7.1.  No Lactic Acidosis.  Repeat CXR yesterday was much better and would correlate more c Flash Pulm edema. It was wildly rotated and will repeat today.  Nebs/Flutter Valve/IS. Cxs NTD.  If gets sicker and appears terminal family would be receptive to hospice palliative care  CAD/CABG/CHF - S/P Lasix.  BNP was only 217 - 372.  Restart PO Lasix.  She has no Edema.  Last EF 40-45 percent from echocardiogram 2007.  Repeating ECHO at this point does not seem necessary  NSTEMI c Trop I 0.06 and stable.  NTD.  Demand.  Follow.  OK for lovenox and not Hep gtt  Severe Protein Cal Malnutrition - Nutrition consult appreciated  Chronic LE Edmea - Stockings and diuretics as able  CKD 4/AKI cr stable at 1.75 - 1.85 and stable.  Restart Lasix.  Replete K  Mild Anemia - Hbg 10.9 - 11.9 Thrombocytopenia - Stable  Dementia and lives ALF.  DNR.  On Meds. Gets panic attacks easily and hallucinations.  Saw cats and dogs in ED.  She is currently pleasant and comfortable.  DVT proph  Skin issues - Keep C/D/I  DNR FL-2 Signed. ? If she will be ready for D/c tom am to [facility]?  ID -  Anti-infectives    Start     Dose/Rate Route Frequency Ordered Stop   11/14/14 2200  vancomycin (VANCOCIN) IVPB 750 mg/150 ml premix     750 mg150 mL/hr over 60 Minutes Intravenous Every 48 hours 11/12/14 2156     11/13/14 0300   piperacillin-tazobactam (ZOSYN) IVPB 2.25 g     2.25 g100 mL/hr over 30 Minutes Intravenous Every 6 hours 11/12/14 2156     11/12/14 2000  vancomycin (VANCOCIN) IVPB 1000 mg/200 mL premix     1,000 mg200 mL/hr over 60 Minutes Intravenous  Once 11/12/14 1948 11/12/14 2224   11/12/14 1945  vancomycin (VANCOCIN) 1,052 mg in sodium chloride 0.9 % 250 mL IVPB  Status:  Discontinued     20 mg/kg  52.6 kg166.7 mL/hr over 90 Minutes Intravenous  Once 11/12/14 1936 11/12/14 1948   11/12/14 1945  piperacillin-tazobactam (ZOSYN) IVPB 3.375 g     3.375 g100 mL/hr over 30 Minutes Intravenous  Once 11/12/14 1936 11/12/14 2122        LOS: 2 days   Dandra Shambaugh M 11/14/2014, 7:49 AM

## 2014-11-14 NOTE — Clinical Documentation Improvement (Signed)
  Please clarify if pt. had Hypoxic Resp Failure and the acuity   . Document acuity: --Acute --Chronic --Acute on chronic   . Document any associated diagnoses/conditions: HCAP pneumonia with CHF  Supporting Information: Oxygen saturation near-normal on 2 L by nasal cannula vanc/zosyn for HCAP and lasix 40 mg for CHF PROVENTIL TID O2 supplement maintaining sats @ 98%  Thank You,  Elpidio AnisGarnet Kiyan Burmester, RN, BSN, CDI [phone] [facility]

## 2014-11-15 LAB — COMPREHENSIVE METABOLIC PANEL
ALBUMIN: 3.2 g/dL — AB (ref 3.5–5.2)
ALT: 9 U/L (ref 0–35)
ANION GAP: 6 (ref 5–15)
AST: 17 U/L (ref 0–37)
Alkaline Phosphatase: 46 U/L (ref 39–117)
BUN: 19 mg/dL (ref 6–23)
CHLORIDE: 104 meq/L (ref 96–112)
CO2: 30 mmol/L (ref 19–32)
CREATININE: 1.55 mg/dL — AB (ref 0.50–1.10)
Calcium: 8.7 mg/dL (ref 8.4–10.5)
GFR, EST AFRICAN AMERICAN: 32 mL/min — AB (ref >90)
GFR, EST NON AFRICAN AMERICAN: 28 mL/min — AB (ref >90)
Glucose, Bld: 107 mg/dL — ABNORMAL HIGH (ref 70–99)
Potassium: 3.1 mmol/L — ABNORMAL LOW (ref 3.5–5.1)
Sodium: 140 mmol/L (ref 135–145)
Total Bilirubin: 0.7 mg/dL (ref 0.3–1.2)
Total Protein: 6.3 g/dL (ref 6.0–8.3)

## 2014-11-15 LAB — CBC
HEMATOCRIT: 31.4 % — AB (ref 36.0–46.0)
Hemoglobin: 10.3 g/dL — ABNORMAL LOW (ref 12.0–15.0)
MCH: 31.1 pg (ref 26.0–34.0)
MCHC: 32.8 g/dL (ref 30.0–36.0)
MCV: 94.9 fL (ref 78.0–100.0)
Platelets: 107 10*3/uL — ABNORMAL LOW (ref 150–400)
RBC: 3.31 MIL/uL — ABNORMAL LOW (ref 3.87–5.11)
RDW: 14.1 % (ref 11.5–15.5)
WBC: 3.1 10*3/uL — AB (ref 4.0–10.5)

## 2014-11-15 MED ORDER — POTASSIUM CHLORIDE CRYS ER 20 MEQ PO TBCR
40.0000 meq | EXTENDED_RELEASE_TABLET | Freq: Two times a day (BID) | ORAL | Status: DC
  Administered 2014-11-15 – 2014-11-18 (×6): 40 meq via ORAL
  Filled 2014-11-15 (×10): qty 2

## 2014-11-15 MED ORDER — CEFUROXIME AXETIL 500 MG PO TABS: 500.0000 mg | ORAL_TABLET | Freq: Two times a day (BID) | ORAL | Status: DC

## 2014-11-15 MED ORDER — FUROSEMIDE 80 MG PO TABS: 40.0000 mg | ORAL_TABLET | Freq: Every day | ORAL | Status: DC

## 2014-11-15 MED ORDER — CEFUROXIME AXETIL 500 MG PO TABS: ORAL_TABLET | ORAL | Status: DC

## 2014-11-15 NOTE — Progress Notes (Signed)
Family updated of patient's status today. Per Dr. Timothy Lassousso, patient is being discharge back to facility. SW updated. Family request transportation by EMS.   Sim BoastHavy, RN

## 2014-11-15 NOTE — Progress Notes (Signed)
RN received call from Dr. [facility] wanting to know why patient is not being discharge. RN explained to MD that patient is ready for discharge per Dr. Timothy Lassousso standpoint and RN is waiting for SW to call facility and set up transport. RN gave Roe RutherfordSw Deb ([facility]) direct number at [phone]. RN unsure why patient is not leaving. Per note, SW requested for weekend CSW to follow up with this process. MD appears upset with this process and appreciate RN communication. Family called and updated r/t her not being d/c today.   Sim BoastHavy, RN
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
## 2014-11-15 NOTE — Discharge Summary (Addendum)
Physician Discharge Summary  DISCHARGE SUMMARY   Patient ID: MICKENZIE STOLAR MR#: [id number] DOB/AGE: Oct 21, 1920 81 y.o.   Attending Physician:Brittlyn Cloe M  Patient's WUJ:WJXBJ,YNWG M, MD  Consults:  none  Admit date: 11/12/2014 Discharge date: 11/15/2014  Discharge Diagnoses:  Active Problems:   Healthcare-associated pneumonia   Protein-calorie malnutrition, severe   Patient Active Problem List   Diagnosis Date Noted  . Protein-calorie malnutrition, severe 11/14/2014  . Healthcare-associated pneumonia 11/12/2014  . Osteomyelitis 12/07/2012   Past Medical History  Diagnosis Date  . Depression   . GERD (gastroesophageal reflux disease)   . Dementia   . CAD (coronary artery disease)   . Cardiomyopathy   . Osteoporosis   . Constipation   . Failure to thrive in childhood   . Arthritis     Discharged Condition: fair   Discharge Medications:   Medication List    STOP taking these medications        albuterol 108 (90 BASE) MCG/ACT inhaler  Commonly known as:  PROVENTIL HFA;VENTOLIN HFA     cephALEXin 500 MG capsule  Commonly known as:  KEFLEX      TAKE these medications        acetaminophen 325 MG tablet  Commonly known as:  TYLENOL  Take 650 mg by mouth every 6 (six) hours as needed (pain).     aspirin 81 MG chewable tablet  Chew 81 mg by mouth daily at 6 (six) AM.     benzonatate 200 MG capsule  Commonly known as:  TESSALON  Take 200 mg by mouth 3 (three) times daily as needed for cough.     cefUROXime 500 MG tablet  Commonly known as:  CEFTIN  Take 1 tablet (500 mg total) by mouth 2 (two) times daily with a meal to complete after last dose on 11/21/14     donepezil 5 MG tablet  Commonly known as:  ARICEPT  Take 5 mg by mouth at bedtime.     furosemide 80 MG tablet  Commonly known as:  LASIX  Take 0.5 tablets (40 mg total) by mouth daily.     haloperidol 1 MG tablet  Commonly known as:  HALDOL  Take 1 mg by mouth at bedtime as needed for  agitation.     losartan 50 MG tablet  Commonly known as:  COZAAR  Take 50 mg by mouth daily at 6 (six) AM.     METAMUCIL PO  Take 1 scoop by mouth daily at 6 (six) AM. Mix in liquid and drink - for gas     nystatin 100000 UNIT/GM Powd  Apply topically 2 (two) times daily. 6am and 6pm - apply under breasts     omeprazole 20 MG capsule  Commonly known as:  PRILOSEC  Take 20 mg by mouth daily at 6 (six) AM.     potassium chloride SA 20 MEQ tablet  Commonly known as:  K-DUR,KLOR-CON  Take 20 mEq by mouth daily.     PROCEL Powd  Take 2 scoop by mouth 3 (three) times daily. Mix 2 scoops in 4 oz of milk/juice and drink at 8am, 2pm and 8pm     raloxifene 60 MG tablet  Commonly known as:  EVISTA  Take 60 mg by mouth daily at 6 (six) AM.     sertraline 25 MG tablet  Commonly known as:  ZOLOFT  Take 25 mg by mouth at bedtime.     triamcinolone cream 0.1 %  Commonly known as:  KENALOG  Apply 1 application topically 3 (three) times daily.        Hospital Procedures: Dg Chest 1 View  11/12/2014   CLINICAL DATA:  Altered mental status and shortness of breath.  EXAM: CHEST - 1 VIEW  COMPARISON:  12/28/2013  FINDINGS: Patient appears to be kyphotic and the jaw overlies the upper chest. New densities at the right costophrenic angle could represent overlying shadows but indeterminate. Slightly prominent peribronchial and interstitial lung markings. Heart size is normal with median sternotomy wires.  IMPRESSION: Limited exam due to patient positioning. Consider follow-up imaging.  Cannot exclude densities at the right costophrenic angle.  Mild peribronchial thickening and cannot exclude mild edema.   Electronically Signed   By: Richarda Overlie M.D.   On: 11/12/2014 16:58   Dg Chest Port 1 View  11/14/2014   CLINICAL DATA:  Shortness of breath, lethargy  EXAM: PORTABLE CHEST - 1 VIEW  COMPARISON:  11/13/2014  FINDINGS: Cardiac shadow is within normal limits. Postoperative changes are again seen. The  lungs are well aerated bilaterally. Mild interstitial changes are again seen. No acute abnormality is noted.  IMPRESSION: No active disease.   Electronically Signed   By: Alcide Clever M.D.   On: 11/14/2014 08:07   Dg Chest Port 1 View  11/13/2014   CLINICAL DATA:  Subsequent evaluation for shortness of breath  EXAM: PORTABLE CHEST - 1 VIEW  COMPARISON:  11/12/14  FINDINGS: Study is very limited by rotation. Stable mild cardiac enlargement. Allowing for this the lungs appear clear. Specifically, no significant right lower lobe infiltrate or right pleural effusion is appreciated.  IMPRESSION: Grossly no acute findings, although the study is significantly limited by patient rotation.   Electronically Signed   By: Esperanza Heir M.D.   On: 11/13/2014 08:46    History of Present Illness: Ms Lawther was seen in ED and Admitted by Dr Felipa Eth on 1/19 and his notes stated: 81 year old female who resides at assisted living, supported by local children, including a daughter and son who are present currently in the emergency room, past medical history significant for moderate dementia, needs assistance with most of her ADLs, does have significant short-term memory impairment but does have ability to converse and recognize her children. She does have a history of hypertension coronary artery disease status post bypass surgery cardiomyopathy congestive heart failure left bundle branch block remote rest cancer osteoporosis, reflux, early to moderate dementia with sundowning confusion and delirium adult failure to thrive and bilateral lower extremity edema as well as depression. She does walk independently with a walker and can feed herself. She has a DO NOT RESUSCITATE CODE STATUS. Shortly after lunch, at the assisted-living facility she was found to have rest or distress increased work of breathing, EMS was called, initially placed her on a see Pap machine or BiPAP machine, given significant hypoxia, transported here to the  emergency room. Even initial differential including pneumonia as well as congestive heart failure patient was given 40 mg of IV Lasix. Resulting workup did include test x-ray concerning for pneumonia, BNP pending, stable EKG with left bundle branch block, mildly elevated troponin I, prerenal azotemia blood and urine cultures obtained, urinalysis unremarkable, lactic acid 1.4, patient admitted for healthcare associated pneumonia, started on Zosyn and vancomycin and I was called to admit the patient. Upon my arrival, son and daughter present, date that she is much more conversant, without respiratory distress while on oxygen, patient does desire to go home but clearly is not cognizant of  her current situation as well as what brought her here to the emergency room. Daughter did confirm DO NOT RESUSCITATE CODE STATUS. Oxygen saturation near-normal on 2 L by nasal cannula, hemodynamically stable as below.  Hospital Course: Admitted 1/19 c Hypoxic Resp Failure - requiring FIO2/NRB/CPAP/BiPAP Original CXR and Temp up to 100.9 c/w PNA - Broad Spectrum Abx given. No recent fevers/WBC issues or CXR findings - We de-escalated ABx 1/21.  She weaned herself off the O2 as she kept taking it off and in fact it was placed on the floor.  For the last 2 days she has denied SOB. I still question this event and believe it was more of a Flash Pulm Edema event as she had significant improvement post IV Lasix given.  Breathing well this am. Looks better than admission I discussed case c daughter yesterday Hazle Nordmann- Belvia @ [phone] on 1/20 and 1/22.  Hypoxic Resp Failure secondary to HCAP (Temps still 99-100 but no active signs of overt illness) and Flash Pulm Edema. She was initially started on Vanco/Zosyn and weaned to just Ceftin Oral.  She has remained off CPAP/BiPAP - FIO2 was abruptly stopped and not needed. Conitnue Pulm Toilet via Incentive spirometer and Flutter valve and time. ? Aspiration c Dementia and age. Did not  need Speech Therapy at this time. WBC only 3.1 - 7.1. No Lactic Acidosis. Repeat CXRs were much better and did not show any PNA that the CXR in ED showed.  She may have not had PNA and it may have been an overcall.  Her clinical picture correlates more c Flash Pulm edema. Nebs provided and stopped.  Cxs NGTD.  Family has indicated that if she gets sicker and appears terminal - family would be receptive to hospice palliative care  CAD/CABG/CHF - S/P Lasix. BNP was only 217 - 372. PO Lasix restarted. She has no Edema. Last EF 40-45 percent from echocardiogram 2007. Repeating ECHO at this point does not seem necessary  NSTEMI c Peak Trop I 0.06 and stable. NTD. Demand. Follow. OK for lovenox in house and not Hep gtt  Severe Protein Cal Malnutrition - Nutrition consult appreciated  Chronic LE Edmea - Stockings and diuretics as able  CKD 4/AKI cr stable at 1.75 - 1.85 and stable. Restarted Lasix. Replete K.  Cr on 1/22 was 1.55  Mild Anemia - Hbg 10.3 - 11.9.  She has mild pancytopenia Thrombocytopenia - Stable  Dementia and lives ALF. DNR. On Meds. Gets panic attacks easily and hallucinations. Saw cats and dogs in ED. She is currently pleasant and comfortable.  DVT proph  Skin issues - Keep C/D/I  DNR FL-2 Signed. Ready for D/c back [facility] when she can be safely transported Seen by PT on 11/14/14 and they recommended Home health PT;Supervision for mobility/OOB - No new equipment.     Day of Discharge Exam BP 139/62 mmHg  Pulse 90  Temp(Src) 99.7 F (37.6 C) (Oral)  Resp 15  Ht 5' 4.17" (1.63 m)  Wt 50.5 kg (111 lb 5.3 oz)  BMI 19.01 kg/m2  SpO2 96%  Physical Exam:  General appearance: Alert - Not oriented. Eyes: no scleral icterus Throat: oropharynx moist without erythema Resp: + Rhonchi Cardio: Reg c + m GI: soft, non-tender; bowel sounds normal; no masses, no organomegaly Extremities: decreased edema Lots of OA issues.  Discharge  Labs:  Recent Labs  11/14/14 0536 11/15/14 0555  NA 140 140  K 3.4* 3.1*  CL 105 104  CO2 26 30  GLUCOSE  128* 107*  BUN 20 19  CREATININE 1.75* 1.55*  CALCIUM 8.8 8.7    Recent Labs  11/14/14 0536 11/15/14 0555  AST 19 17  ALT 9 9  ALKPHOS 49 46  BILITOT 0.9 0.7  PROT 6.6 6.3  ALBUMIN 3.5 3.2*    Recent Labs  11/12/14 1715  11/14/14 0536 11/15/14 0555  WBC 7.1  < > 4.7 3.1*  NEUTROABS 6.1  --   --   --   HGB 11.9*  < > 10.9* 10.3*  HCT 36.7  < > 33.3* 31.4*  MCV 96.3  < > 96.2 94.9  PLT 111*  < > 104* 107*  < > = values in this interval not displayed.  Recent Labs  11/12/14 1715 11/14/14 0536  TROPONINI 0.06* 0.06*   No results for input(s): TSH, T4TOTAL, T3FREE, THYROIDAB in the last 72 hours.  Invalid input(s): FREET3 No results for input(s): VITAMINB12, FOLATE, FERRITIN, TIBC, IRON, RETICCTPCT in the last 72 hours. No results found for: INR, PROTIME     Discharge instructions:  01-Home or Self Care Follow-up Information    Follow up with Gwen Pounds, MD In 1 week.   Specialty:  Internal Medicine   Contact information:   [address] [place] [address] [phone]        Disposition: [facility]  Follow-up Appts: Follow-up with Dr. Timothy Lasso at [facility] in 1 week.  Call for appointment.  Stable but poor  Tests Needing Follow-up: Labs in my office  Time spent in discharge (includes decision making & examination of pt): 30 min  Signed: Zaidy Absher M 11/15/2014, 7:39 AM

## 2014-11-15 NOTE — Progress Notes (Signed)
Physical Therapy Treatment Patient Details Name: [name] MRN: [id number] DOB: 09-19-1920 [name] Date: 11/15/2014    History of Present Illness 81 year old female who resides at assisted living, supported by local children, including a daughter and son who are present currently in the emergency room, past medical history significant for moderate dementia, needs assistance with most of her ADLs, does have she is significant short-term memory impairment but does ability to converse and recognize her children. She does have a history of hypertension coronary artery disease status post bypass surgery cardiomyopathy congestive heart failure left bundle branch block remote rest cancer osteoporosis, reflux, early to moderate dementia with sundowning confusion and delirium adult failure to thrive and bilateral lower extremity edema as well as depression. She does walk independently with a walker and can feed herself. She has a DO NOT RESUSCITATE CODE STATUS. Shortly after lunch, at the assisted-living facility she was found to have rest or distress increased work of breathing, EMS was called, initially placed her on a see Pap machine or BiPAP machine, given significant hypoxia, transported here to the emergency room.    PT Comments    Progressing towards physical therapy goals. Min guard with most mobility today. Needs frequent cues and directions for tasks and is easily distracted. Ambulated 30 feet today; did not wish to ambulate into hall. Patient will continue to benefit from skilled physical therapy services to further improve independence with functional mobility.   Follow Up Recommendations  Home health PT;Supervision for mobility/OOB     Equipment Recommendations  None recommended by PT    Recommendations for Other Services       Precautions / Restrictions Restrictions Weight Bearing Restrictions: No    Mobility  Bed Mobility Overal bed mobility: Needs Assistance Bed Mobility:  Supine to Sit     Supine to sit: Min assist     General bed mobility comments: Hand held guidance to sit upright. Able to scoot to edge of bed with cues for direction.  Transfers Overall transfer level: Needs assistance Equipment used: Rolling walker (2 wheeled) Transfers: Sit to/from Stand Sit to Stand: Min guard         General transfer comment: Close guard for safety. Slow to rise. VC for hand placement. Poor control with descent into chair.  Ambulation/Gait Ambulation/Gait assistance: Min guard Ambulation Distance (Feet): 35 Feet Assistive device: Rolling walker (2 wheeled) Gait Pattern/deviations: Step-through pattern;Decreased stride length;Shuffle;Trunk flexed;Narrow base of support Gait velocity: slow   General Gait Details: Very small steps. Easily distracted, Did not want to ambulate into hall. Demonstrates good walker control but needs cues not to lift with turns. VC for upright posture and forward gaze intermittently. Close guard for safety.   Stairs            Wheelchair Mobility    Modified Rankin (Stroke Patients Only)       Balance                                    Cognition Arousal/Alertness: Awake/alert Behavior During Therapy: WFL for tasks assessed/performed;Flat affect Overall Cognitive Status: History of cognitive impairments - at baseline                      Exercises General Exercises - Lower Extremity Ankle Circles/Pumps: AROM;Both;10 reps;Seated Heel Slides: Strengthening;Both;10 reps;Seated Straight Leg Raises: Strengthening;Both;10 reps;Supine    General Comments General comments (skin integrity, edema, etc.):  Very poor short term memory. Disoriented to place.      Pertinent Vitals/Pain Pain Assessment: No/denies pain Faces Pain Scale: Hurts little more Pain Intervention(Schwartz): Monitored during session    Home Living                      Prior Function            PT Goals (current  goals can now be found in the care plan section) Acute Rehab PT Goals Patient Stated Goal: none stated. PT Goal Formulation: Patient unable to participate in goal setting Time For Goal Achievement: 11/21/14 Potential to Achieve Goals: Good Progress towards PT goals: Progressing toward goals    Frequency  Min 3X/week    PT Plan Current plan remains appropriate    Co-evaluation             End of Session   Activity Tolerance: Patient tolerated treatment well Patient left: in chair;with call bell/phone within reach;with chair alarm set     Time: 1521-1540 PT Time Calculation (min) (ACUTE ONLY): 19 min  Charges:  $Gait Training: 8-22 mins                    G Codes:      [name], [name] 11/15/2014, 3:57 PM Katie SpillersLogan Schwartz [name], [place] [phone]

## 2014-11-15 NOTE — Clinical Social Work Note (Addendum)
Patient medically stable for discharge. CSW has requested that weekend CSW follow-up with facility to determine if they will accept patient on Sat., 1/23.  Genelle BalVanessa Crawford, MSW, LCSW Licensed Clinical Social Worker Clinical Social Work Department [facility] [phone]  Read the notes...she was supposed to go 1/22 NOT be left "for the weekend CSW"

## 2014-11-16 LAB — CBC
HEMATOCRIT: 32.7 % — AB (ref 36.0–46.0)
Hemoglobin: 10.8 g/dL — ABNORMAL LOW (ref 12.0–15.0)
MCH: 30.9 pg (ref 26.0–34.0)
MCHC: 33 g/dL (ref 30.0–36.0)
MCV: 93.7 fL (ref 78.0–100.0)
Platelets: 110 10*3/uL — ABNORMAL LOW (ref 150–400)
RBC: 3.49 MIL/uL — AB (ref 3.87–5.11)
RDW: 14 % (ref 11.5–15.5)
WBC: 2.7 10*3/uL — AB (ref 4.0–10.5)

## 2014-11-16 LAB — COMPREHENSIVE METABOLIC PANEL
ALK PHOS: 49 U/L (ref 39–117)
ALT: 10 U/L (ref 0–35)
AST: 20 U/L (ref 0–37)
Albumin: 3.2 g/dL — ABNORMAL LOW (ref 3.5–5.2)
Anion gap: 12 (ref 5–15)
BILIRUBIN TOTAL: 0.5 mg/dL (ref 0.3–1.2)
BUN: 19 mg/dL (ref 6–23)
CALCIUM: 8.7 mg/dL (ref 8.4–10.5)
CHLORIDE: 104 mmol/L (ref 96–112)
CO2: 23 mmol/L (ref 19–32)
Creatinine, Ser: 1.41 mg/dL — ABNORMAL HIGH (ref 0.50–1.10)
GFR calc Af Amer: 36 mL/min — ABNORMAL LOW (ref >90)
GFR calc non Af Amer: 31 mL/min — ABNORMAL LOW (ref >90)
Glucose, Bld: 110 mg/dL — ABNORMAL HIGH (ref 70–99)
POTASSIUM: 3.1 mmol/L — AB (ref 3.5–5.1)
SODIUM: 139 mmol/L (ref 135–145)
TOTAL PROTEIN: 6.2 g/dL (ref 6.0–8.3)

## 2014-11-16 MED ORDER — LORAZEPAM 0.5 MG PO TABS: 0.5000 mg | ORAL_TABLET | Freq: Once | ORAL | Status: DC

## 2014-11-16 MED ORDER — POTASSIUM CHLORIDE 10 MEQ/100ML IV SOLN
10.0000 meq | INTRAVENOUS | Status: AC
  Administered 2014-11-16 (×2): 10 meq via INTRAVENOUS
  Filled 2014-11-16 (×2): qty 100

## 2014-11-16 NOTE — Progress Notes (Signed)
No return call from SW, attempted to contact a SW in the ER department, no SW in ER on weekend evening or night. Call placed to Dr Evlyn [facility] to inform him that pt is still here, and no further information from SW about pt transporting out to her facility today. Pt will remain here tonight and will again check with SW in am and assess the plan for pt to be transported out. Will also pass info on to oncoming RN and unit charge nurse; Derryl HarborLonnie.

## 2014-11-16 NOTE — Progress Notes (Signed)
Was informed by Charge nurse on unit, [place] that SW on today stated PTAR (transportation) was running today. Have not heard back from SW today as to when pt would be ready to go and SW make arrangements for transportation. Message left at this time on SW's voice mail as to where we are in getting pt transferred out today. Due to weather conditions transportation may be backed up. Waiting to hear back.

## 2014-11-16 NOTE — Progress Notes (Signed)
Subjective: Everything was prepared for her to leave yesterday. Not sure what went wrong Doing OK,. Was agitated last night but this has resolved. No pain K is a bit low   Objective: Vital signs in last 24 hours: Temp:  [98.4 F (36.9 C)-98.6 F (37 C)] 98.4 F (36.9 C) (01/22 2330) Pulse Rate:  [81-90] 81 (01/23 0550) Resp:  [15-16] 15 (01/23 0549) BP: (127-152)/(68-73) 152/73 mmHg (01/23 0549) SpO2:  [96 %-100 %] 96 % (01/23 0550) Weight:  [50.803 kg (112 lb)] 50.803 kg (112 lb) (01/23 0512)  Intake/Output from previous day: 01/22 0701 - 01/23 0700 In: 360 [P.O.:360] Out: 1100 [Urine:1100] Intake/Output this shift: Total I/O In: 240 [P.O.:240] Out: 300 [Urine:300]  Frail WF in no distress. Lungs clear ht regular abd soft NT. Confused but calm  Lab Results   Recent Labs  11/15/14 0555 11/16/14 0450  WBC 3.1* 2.7*  RBC 3.31* 3.49*  HGB 10.3* 10.8*  HCT 31.4* 32.7*  MCV 94.9 93.7  MCH 31.1 30.9  RDW 14.1 14.0  PLT 107* 110*    Recent Labs  11/15/14 0555 11/16/14 0450  NA 140 139  K 3.1* 3.1*  CL 104 104  CO2 30 23  GLUCOSE 107* 110*  BUN 19 19  CREATININE 1.55* 1.41*  CALCIUM 8.7 8.7    Studies/Results: No results found.  Scheduled Meds: . aspirin  81 mg Oral Q0600  . cefUROXime  500 mg Oral BID WC  . donepezil  5 mg Oral QHS  . enoxaparin (LOVENOX) injection  30 mg Subcutaneous Q24H  . furosemide  20 mg Oral Daily  . LORazepam  0.5 mg Oral Once  . losartan  50 mg Oral Q0600  . nystatin   Topical Q12H  . pantoprazole  40 mg Oral Daily  . potassium chloride  10 mEq Intravenous Q1 Hr x 2  . potassium chloride  40 mEq Oral BID  . protein supplement  2 scoop Oral 3 times per day  . psyllium  1 packet Oral Daily  . sertraline  25 mg Oral QHS   Continuous Infusions:  PRN Meds:acetaminophen, benzonatate, haloperidol, ipratropium-albuterol  Assessment/Plan:  PLAN REMAINS THE SAME HERE IS DR. Ferd HibbsUSSO'S SUMMARY OF ISSUES    LOS: 4 days  Admitted  1/19 c Hypoxic Resp Failure - requiring FIO2/NRB/CPAP/BiPAP Original CXR and Temp up to 100.9 c/w PNA - Broad Spectrum Abx given. No recent fevers/WBC issues or CXR findings - We de-escalated ABx 1/21. She weaned herself off the O2 as she kept taking it off and in fact it was placed on the floor. For the last 2 days she has denied SOB. I still question this event and believe it was more of a Flash Pulm Edema event as she had significant improvement post IV Lasix given.  Breathing well this am. Looks better than admission I discussed case c daughter yesterday Hazle Nordmann- Belvia @ [phone] on 1/20 and 1/22.  Hypoxic Resp Failure secondary to HCAP (Temps still 99-100 but no active signs of overt illness) and Flash Pulm Edema. She was initially started on Vanco/Zosyn and weaned to just Ceftin Oral. She has remained off CPAP/BiPAP - FIO2 was abruptly stopped and not needed. Conitnue Pulm Toilet via Incentive spirometer and Flutter valve and time. ? Aspiration c Dementia and age. Did not need Speech Therapy at this time. WBC only 3.1 - 7.1. No Lactic Acidosis. Repeat CXRs were much better and did not show any PNA that the CXR in ED showed. She may have  not had PNA and it may have been an overcall. Her clinical picture correlates more c Flash Pulm edema. Nebs provided and stopped. Cxs NGTD.  Family has indicated that if she gets sicker and appears terminal - family would be receptive to hospice palliative care  CAD/CABG/CHF - S/P Lasix. BNP was only 217 - 372. PO Lasix restarted. She has no Edema. Last EF 40-45 percent from echocardiogram 2007. Repeating ECHO at this point does not seem necessary  NSTEMI c Peak Trop I 0.06 and stable. NTD. Demand. Follow. OK for lovenox in house and not Hep gtt  Severe Protein Cal Malnutrition - Nutrition consult appreciated  Chronic LE Edmea - Stockings and diuretics as able  CKD 4/AKI cr stable at 1.75 - 1.85 and stable. Restarted Lasix. Replete K.  Cr on 1/22 was 1.55  Mild Anemia - Hbg 10.3 - 11.9. She has mild pancytopenia Thrombocytopenia - Stable  Dementia and lives ALF. DNR. On Meds. Gets panic attacks easily and hallucinations. Saw cats and dogs in ED. She is currently pleasant and comfortable.  HYPOKALEMIA: give 2 IV boluses  [name] 11/16/2014, 11:06 AM

## 2014-11-17 NOTE — Social Work (Signed)
CSW attempted to contact the facility on 1/23 and 1/24, no answer either day. Message left.  CSW will continue to follow. Beverly Sessionsywan J Ritha Sampedro MSW, LCSW [phone]

## 2014-11-17 NOTE — Progress Notes (Signed)
Subjective: Still here and stable. Calm this AM. Still ready to go to ALF  Objective: Vital signs in last 24 hours: Temp:  [98 F (36.7 C)-98.6 F (37 C)] 98 F (36.7 C) (01/24 0542) Pulse Rate:  [80-85] 85 (01/24 0542) Resp:  [16-18] 18 (01/24 0542) BP: (119-140)/(67-73) 119/67 mmHg (01/24 0542) SpO2:  [95 %-98 %] 95 % (01/24 0542) Weight:  [51.529 kg (113 lb 9.6 oz)] 51.529 kg (113 lb 9.6 oz) (01/24 0500)  Intake/Output from previous day: 01/23 0701 - 01/24 0700 In: 660 [P.O.:660] Out: 600 [Urine:600] Intake/Output this shift: Total I/O In: 300 [P.O.:300] Out: 300 [Urine:300]  Sitting up in no distress face symmetric today. Lungs clear. Ht regular abd soft NT. extrems very thin. Calm confused  Lab Results   Recent Labs  11/15/14 0555 11/16/14 0450  WBC 3.1* 2.7*  RBC 3.31* 3.49*  HGB 10.3* 10.8*  HCT 31.4* 32.7*  MCV 94.9 93.7  MCH 31.1 30.9  RDW 14.1 14.0  PLT 107* 110*    Recent Labs  11/15/14 0555 11/16/14 0450  NA 140 139  K 3.1* 3.1*  CL 104 104  CO2 30 23  GLUCOSE 107* 110*  BUN 19 19  CREATININE 1.55* 1.41*  CALCIUM 8.7 8.7    Studies/Results: No results found.  Scheduled Meds: . aspirin  81 mg Oral Q0600  . cefUROXime  500 mg Oral BID WC  . donepezil  5 mg Oral QHS  . enoxaparin (LOVENOX) injection  30 mg Subcutaneous Q24H  . furosemide  20 mg Oral Daily  . LORazepam  0.5 mg Oral Once  . losartan  50 mg Oral Q0600  . nystatin   Topical Q12H  . pantoprazole  40 mg Oral Daily  . potassium chloride  40 mEq Oral BID  . protein supplement  2 scoop Oral 3 times per day  . psyllium  1 packet Oral Daily  . sertraline  25 mg Oral QHS   Continuous Infusions:  PRN Meds:acetaminophen, benzonatate, haloperidol, ipratropium-albuterol  Assessment/Plan: NO CHANGES PT STILL READY TO GO TO ALF WHENEVER TRANSPORT IS ARRANGED   LOS: 5 days   Margurite Duffy ALAN 11/17/2014, 11:15 AM

## 2014-11-17 NOTE — Social Work (Signed)
CSW called and was able to contact [name] at ALF at 5:18pm. [facility].  Facility will contact RN who will call PTAR. CSW has prepared patient packet for discharge will all necessary documentation.  Beverly Sessionsywan J Tanayia Wahlquist MSW, LCSW [phone]

## 2014-11-17 NOTE — Progress Notes (Signed)
Received call from Toniann FailWendy, Librarian, academicexecutive director from [facility] and per conversation they are unable to receive at this late, and they won't be able to provide necessary meds for pt at this time. Per conversation they would like to receive  pt tomorrow am.

## 2014-11-17 NOTE — Progress Notes (Signed)
Received call from Elease HashimotoPatricia in [facility] and requested medication list and FL2 form signed by MD to fax over  before they review for final confirmation. Faxed all the requested document and attempted to call Elease HashimotoPatricia . Will cont to attempt to contact .

## 2014-11-18 LAB — BASIC METABOLIC PANEL
ANION GAP: 11 (ref 5–15)
BUN: 17 mg/dL (ref 6–23)
CO2: 23 mmol/L (ref 19–32)
Calcium: 8.8 mg/dL (ref 8.4–10.5)
Chloride: 106 mmol/L (ref 96–112)
Creatinine, Ser: 1.22 mg/dL — ABNORMAL HIGH (ref 0.50–1.10)
GFR calc Af Amer: 43 mL/min — ABNORMAL LOW (ref >90)
GFR, EST NON AFRICAN AMERICAN: 37 mL/min — AB (ref >90)
Glucose, Bld: 90 mg/dL (ref 70–99)
Potassium: 4.8 mmol/L (ref 3.5–5.1)
Sodium: 140 mmol/L (ref 135–145)

## 2014-11-18 LAB — CBC
HCT: 33.5 % — ABNORMAL LOW (ref 36.0–46.0)
Hemoglobin: 11.1 g/dL — ABNORMAL LOW (ref 12.0–15.0)
MCH: 31 pg (ref 26.0–34.0)
MCHC: 33.1 g/dL (ref 30.0–36.0)
MCV: 93.6 fL (ref 78.0–100.0)
Platelets: 141 10*3/uL — ABNORMAL LOW (ref 150–400)
RBC: 3.58 MIL/uL — ABNORMAL LOW (ref 3.87–5.11)
RDW: 13.8 % (ref 11.5–15.5)
WBC: 3 10*3/uL — AB (ref 4.0–10.5)

## 2014-11-18 MED ORDER — IPRATROPIUM-ALBUTEROL 18-103 MCG/ACT IN AERO: 2.0000 | INHALATION_SPRAY | Freq: Four times a day (QID) | RESPIRATORY_TRACT | Status: AC | PRN

## 2014-11-18 NOTE — Clinical Social Work Note (Signed)
Clinical Social Worker facilitated patient discharge including contacting patient family and facility to confirm patient discharge plans.  Clinical information faxed to facility and family agreeable with plan.  CSW arranged ambulance transport via [facility] to [facility].  RN to call report prior to discharge.  Clinical Social Worker will sign off for now as social work intervention is no longer needed. Please consult us again if new need arises.  Macario GoldsJesse Kerrie Timm, [place] [phone]

## 2014-11-18 NOTE — Discharge Summary (Signed)
Physician Discharge Summary  DISCHARGE SUMMARY   Patient ID: [name] MR#: [id number] DOB/AGE: 11-18-1919 81 y.o.   Attending Physician:Audryana Hockenberry M  Patient's OZH:YQMVH,QION M, MD  Consults:  none  Admit date: 11/12/2014 Discharge date: 11/18/2014  Discharge Diagnoses:  Active Problems:   Healthcare-associated pneumonia   Protein-calorie malnutrition, severe   Patient Active Problem List   Diagnosis Date Noted  . Protein-calorie malnutrition, severe 11/14/2014  . Healthcare-associated pneumonia 11/12/2014  . Osteomyelitis 12/07/2012   Past Medical History  Diagnosis Date  . Depression   . GERD (gastroesophageal reflux disease)   . Dementia   . CAD (coronary artery disease)   . Cardiomyopathy   . Osteoporosis   . Constipation   . Failure to thrive in childhood   . Arthritis     Discharged Condition: fair   Discharge Medications:   Medication List    STOP taking these medications        albuterol 108 (90 BASE) MCG/ACT inhaler  Commonly known as:  PROVENTIL HFA;VENTOLIN HFA     cephALEXin 500 MG capsule  Commonly known as:  KEFLEX      TAKE these medications        acetaminophen 325 MG tablet  Commonly known as:  TYLENOL  Take 650 mg by mouth every 6 (six) hours as needed (pain).     albuterol-ipratropium 18-103 MCG/ACT inhaler  Commonly known as:  COMBIVENT  Inhale 2 puffs into the lungs every 6 (six) hours as needed for wheezing or shortness of breath.     aspirin 81 MG chewable tablet  Chew 81 mg by mouth daily at 6 (six) AM.     benzonatate 200 MG capsule  Commonly known as:  TESSALON  Take 200 mg by mouth 3 (three) times daily as needed for cough.     cefUROXime 500 MG tablet  Commonly known as:  CEFTIN  Take 1 tablet (500 mg total) by mouth 2 (two) times daily with a meal to complete after last dose on 11/21/14     donepezil 5 MG tablet  Commonly known as:  ARICEPT  Take 5 mg by mouth at bedtime.     furosemide 80 MG tablet   Commonly known as:  LASIX  Take 0.5 tablets (40 mg total) by mouth daily.     haloperidol 1 MG tablet  Commonly known as:  HALDOL  Take 1 mg by mouth at bedtime as needed for agitation.     losartan 50 MG tablet  Commonly known as:  COZAAR  Take 50 mg by mouth daily at 6 (six) AM.     METAMUCIL PO  Take 1 scoop by mouth daily at 6 (six) AM. Mix in liquid and drink - for gas     nystatin 100000 UNIT/GM Powd  Apply topically 2 (two) times daily. 6am and 6pm - apply under breasts     omeprazole 20 MG capsule  Commonly known as:  PRILOSEC  Take 20 mg by mouth daily at 6 (six) AM.     potassium chloride SA 20 MEQ tablet  Commonly known as:  K-DUR,KLOR-CON  Take 20 mEq by mouth daily.     PROCEL Powd  Take 2 scoop by mouth 3 (three) times daily. Mix 2 scoops in 4 oz of milk/juice and drink at 8am, 2pm and 8pm     raloxifene 60 MG tablet  Commonly known as:  EVISTA  Take 60 mg by mouth daily at 6 (six) AM.  sertraline 25 MG tablet  Commonly known as:  ZOLOFT  Take 25 mg by mouth at bedtime.     triamcinolone cream 0.1 %  Commonly known as:  KENALOG  Apply 1 application topically 3 (three) times daily.        Hospital Procedures: Dg Chest 1 View  11/12/2014   CLINICAL DATA:  Altered mental status and shortness of breath.  EXAM: CHEST - 1 VIEW  COMPARISON:  12/28/2013  FINDINGS: Patient appears to be kyphotic and the jaw overlies the upper chest. New densities at the right costophrenic angle could represent overlying shadows but indeterminate. Slightly prominent peribronchial and interstitial lung markings. Heart size is normal with median sternotomy wires.  IMPRESSION: Limited exam due to patient positioning. Consider follow-up imaging.  Cannot exclude densities at the right costophrenic angle.  Mild peribronchial thickening and cannot exclude mild edema.   Electronically Signed   By: Richarda Overlie M.D.   On: 11/12/2014 16:58   Dg Chest Port 1 View  11/14/2014   CLINICAL  DATA:  Shortness of breath, lethargy  EXAM: PORTABLE CHEST - 1 VIEW  COMPARISON:  11/13/2014  FINDINGS: Cardiac shadow is within normal limits. Postoperative changes are again seen. The lungs are well aerated bilaterally. Mild interstitial changes are again seen. No acute abnormality is noted.  IMPRESSION: No active disease.   Electronically Signed   By: Alcide Clever M.D.   On: 11/14/2014 08:07   Dg Chest Port 1 View  11/13/2014   CLINICAL DATA:  Subsequent evaluation for shortness of breath  EXAM: PORTABLE CHEST - 1 VIEW  COMPARISON:  11/12/14  FINDINGS: Study is very limited by rotation. Stable mild cardiac enlargement. Allowing for this the lungs appear clear. Specifically, no significant right lower lobe infiltrate or right pleural effusion is appreciated.  IMPRESSION: Grossly no acute findings, although the study is significantly limited by patient rotation.   Electronically Signed   By: Esperanza Heir M.D.   On: 11/13/2014 08:46    History of Present Illness: [name] was seen in ED and Admitted by Dr Felipa Eth on 1/19 and his notes stated: 81 year old female who resides at assisted living, supported by local children, including a daughter and son who are present currently in the emergency room, past medical history significant for moderate dementia, needs assistance with most of her ADLs, does have significant short-term memory impairment but does have ability to converse and recognize her children. She does have a history of hypertension coronary artery disease status post bypass surgery cardiomyopathy congestive heart failure left bundle branch block remote rest cancer osteoporosis, reflux, early to moderate dementia with sundowning confusion and delirium adult failure to thrive and bilateral lower extremity edema as well as depression. She does walk independently with a walker and can feed herself. She has a DO NOT RESUSCITATE CODE STATUS. Shortly after lunch, at the assisted-living facility she was  found to have rest or distress increased work of breathing, EMS was called, initially placed her on a see Pap machine or BiPAP machine, given significant hypoxia, transported here to the emergency room. Even initial differential including pneumonia as well as congestive heart failure patient was given 40 mg of IV Lasix. Resulting workup did include test x-ray concerning for pneumonia, BNP pending, stable EKG with left bundle branch block, mildly elevated troponin I, prerenal azotemia blood and urine cultures obtained, urinalysis unremarkable, lactic acid 1.4, patient admitted for healthcare associated pneumonia, started on Zosyn and vancomycin and I was called to admit the  patient. Upon my arrival, son and daughter present, date that she is much more conversant, without respiratory distress while on oxygen, patient does desire to go home but clearly is not cognizant of her current situation as well as what brought her here to the emergency room. Daughter did confirm DO NOT RESUSCITATE CODE STATUS. Oxygen saturation near-normal on 2 L by nasal cannula, hemodynamically stable as below.   Hospital Course: Admitted 1/19 c Hypoxic Resp Failure - requiring FIO2/NRB/CPAP/BiPAP Original CXR and Temp up to 100.9 c/w PNA - Broad Spectrum Abx given. No recent fevers/WBC issues or CXR findings - We de-escalated ABx 1/21.  She weaned herself off the O2 as she kept taking it off and in fact it was placed on the floor.  For the last 2 days she has denied SOB. I still question this event and believe it was more of a Flash Pulm Edema event as she had significant improvement post IV Lasix given.  Breathing well this am. Looks better than admission I discussed case c daughter yesterday Hazle Nordmann @ [phone] on 1/20 and tried on 1/22.  I spoke to her on 1/25 and updated plan.  Hypoxic Resp Failure secondary to HCAP (Temps still 99-100 but no active signs of overt illness) and Flash Pulm Edema. She was initially started on  Vanco/Zosyn and weaned to just Ceftin Oral.  She has remained off CPAP/BiPAP - FIO2 was abruptly stopped and not needed. Conitnue Pulm Toilet via Incentive spirometer and Flutter valve and time. ? Aspiration c Dementia and age. Did not need Speech Therapy at this time. WBC only 2.7 - 7.1. No Lactic Acidosis. Repeat CXRs were much better and did not show any PNA that the CXR in ED showed.  She may have not had PNA and it may have been an overcall.  Her clinical picture correlates more c Flash Pulm edema. Nebs provided and stopped.  Cxs NGTD. zzShe will complete Ceftin after 1/28 dosing. Family has indicated that if she gets sicker and appears terminal - family would be receptive to hospice palliative care  CAD/CABG/CHF - S/P Lasix. BNP was only 217 - 372. PO Lasix restarted. She has no Edema. Last EF 40-45 percent from echocardiogram 2007. Repeating ECHO at this point does not seem necessary  NSTEMI c Peak Trop I 0.06 and stable. NTD. Demand. Follow. OK for lovenox in house and not Hep gtt  Severe Protein Cal Malnutrition - Nutrition consult appreciated  Chronic LE Edmea - Stockings and diuretics as able  CKD 4/AKI cr stable at 1.41 - 1.85 and stable. continue lasix and K  Mild Anemia - Hbg 10.3 - 11.9.  She has mild pancytopenia Thrombocytopenia - Stable  Dementia and lives ALF. DNR. On Meds. Gets panic attacks easily and hallucinations. Saw cats and dogs in ED. She is currently pleasant and comfortable.  DVT proph provided  Skin issues - Keep C/D/I  DNR FL-2 Signed. Ready for D/c back [facility] when she can be safely transported Seen by PT on 11/14/14 and they recommended Home health PT;Supervision for mobility/OOB - No new equipment. Pt was ready for D/c on Friday 1/22 but spent the weekend d/t transport issues She is ready to go 11/18/14.  No new issues or complaints. She is a little sleepy this am.  Good sats on room air.  Lungs still junky.  Combivent will be  used prn     Day of Discharge Exam BP 146/66 mmHg  Pulse 74  Temp(Src) 99.5 F (37.5 C) (Oral)  Resp 18  Ht 5' 4.17" (1.63 m)  Wt 48.6 kg (107 lb 2.3 oz)  BMI 18.29 kg/m2  SpO2 99%  Physical Exam:  General appearance: Alert - Not oriented. Eyes: no scleral icterus Throat: oropharynx moist without erythema Resp: decreased Rhonchi Cardio: Reg c + m GI: soft, non-tender; bowel sounds normal; no masses, no organomegaly Extremities: decreased edema Lots of OA issues.  Discharge Labs:  Recent Labs  11/16/14 0450  NA 139  K 3.1*  CL 104  CO2 23  GLUCOSE 110*  BUN 19  CREATININE 1.41*  CALCIUM 8.7    Recent Labs  11/16/14 0450  AST 20  ALT 10  ALKPHOS 49  BILITOT 0.5  PROT 6.2  ALBUMIN 3.2*    Recent Labs  11/16/14 0450  WBC 2.7*  HGB 10.8*  HCT 32.7*  MCV 93.7  PLT 110*   No results for input(s): CKTOTAL, CKMB, CKMBINDEX, TROPONINI in the last 72 hours. No results for input(s): TSH, T4TOTAL, T3FREE, THYROIDAB in the last 72 hours.  Invalid input(s): FREET3 No results for input(s): VITAMINB12, FOLATE, FERRITIN, TIBC, IRON, RETICCTPCT in the last 72 hours. No results found for: INR, PROTIME     Discharge instructions:  01-Home or Self Care Follow-up Information    Follow up with Gwen PoundsUSSO,Luke Falero M, MD In 1 week.   Specialty:  Internal Medicine   Contact information:   [address] [place] [id number] [phone]        [facility]  Follow-up Appts: Follow-[facility] in 1 week.  Call for appointment.  Stable but poor  Tests Needing Follow-up: Labs in my office  Time spent in discharge (includes decision making & examination of pt): 30 min  Signed: Isaah Furry M 11/18/2014, 8:07 AM

## 2014-11-18 NOTE — Clinical Social Work Note (Signed)
CSW notes that patient has not been discharged in a timely manner. This CSW will ensure patient is discharged [facility] today.    Roddie McBryant Neah Sporrer MSW, [place], [place], [phone]

## 2014-11-18 NOTE — Progress Notes (Signed)
Physical Therapy Treatment Patient Details Name: Katie SacksMary A Bohnet MRN: [id number] DOB: [birth year] Today's Date: 11/18/2014    History of Present Illness 81 year old female who resides at assisted living, supported by local children, including a daughter and son who are present currently in the emergency room, past medical history significant for moderate dementia, needs assistance with most of her ADLs, does have she is significant short-term memory impairment but does ability to converse and recognize her children. She does have a history of hypertension coronary artery disease status post bypass surgery cardiomyopathy congestive heart failure left bundle branch block remote rest cancer osteoporosis, reflux, early to moderate dementia with sundowning confusion and delirium adult failure to thrive and bilateral lower extremity edema as well as depression. She does walk independently with a walker and can feed herself. She has a DO NOT RESUSCITATE CODE STATUS. Shortly after lunch, at the assisted-living facility she was found to have rest or distress increased work of breathing, EMS was called, initially placed her on a see Pap machine or BiPAP machine, given significant hypoxia, transported here to the emergency room.    PT Comments    Progress expected to be slow, but it will have to incorporate a warm up exercise routine to loosen up stiff joints so she can perform better.  Follow Up Recommendations  Other (comment) ([facility])     Equipment Recommendations  None recommended by PT    Recommendations for Other Services       Precautions / Restrictions Precautions Precautions: Fall Restrictions Weight Bearing Restrictions: No    Mobility  Bed Mobility Overal bed mobility: Needs Assistance Bed Mobility: Supine to Sit;Sit to Supine     Supine to sit: Min assist Sit to supine: Min assist   General bed mobility comments: guidance and vc's with minimal truncal assist  Transfers Overall  transfer level: Needs assistance Equipment used: 1 person hand held assist Transfers: Sit to/from [employer]Stand;Stand Pivot Transfers Sit to Stand: Min assist Stand pivot transfers: Min assist       General transfer comment: assist for lower surfaces.  cues for hand placement  Ambulation/Gait Ambulation/Gait assistance: Min assist Ambulation Distance (Feet): 60 Feet Assistive device: 1 person hand held assist Gait Pattern/deviations: Step-through pattern Gait velocity: slow   General Gait Details: short stiff steps with mildly posteriorly listed posture   Stairs            Wheelchair Mobility    Modified Rankin (Stroke Patients Only)       Balance Overall balance assessment: Needs assistance   Sitting balance-Leahy Scale: Fair       Standing balance-Leahy Scale: Poor Standing balance comment: leans posteriorly- may be from scoliosis, but keeps her mildly unsteady                    Cognition Arousal/Alertness: Awake/alert Behavior During Therapy: WFL for tasks assessed/performed;Flat affect Overall Cognitive Status: History of cognitive impairments - at baseline                      Exercises General Exercises - Lower Extremity Heel Slides: AAROM;Both;10 reps;Supine (as warm up exercise due to stiffness)    General Comments General comments (skin integrity, edema, etc.): poor STM,  not oriented to place or situation      Pertinent Vitals/Pain Pain Assessment: Faces Faces Pain Scale: Hurts little more Pain Location: knees Pain Descriptors / Indicators: Grimacing Pain Intervention(s): Limited activity within patient's tolerance    Home Living  Prior Function            PT Goals (current goals can now be found in the care plan section) Acute Rehab PT Goals Patient Stated Goal: none stated. PT Goal Formulation: Patient unable to participate in goal setting Time For Goal Achievement: 11/21/14 Potential to  Achieve Goals: Good Progress towards PT goals: Progressing toward goals    Frequency  Min 3X/week    PT Plan Current plan remains appropriate    Co-evaluation             End of Session   Activity Tolerance: Patient tolerated treatment well Patient left: in bed;with call bell/phone within reach     Time: 1115-1136 PT Time Calculation (min) (ACUTE ONLY): 21 min  Charges:  $Gait Training: 8-22 mins                    G Codes:      Naw Lasala, Eliseo Gum 11/18/2014, 11:48 AM 11/18/2014  [place] [employer], PT [phone]  (pager)

## 2014-11-19 ENCOUNTER — Emergency Department (HOSPITAL_COMMUNITY): Payer: Medicare Other

## 2014-11-19 ENCOUNTER — Emergency Department (HOSPITAL_COMMUNITY)
Admission: EM | Admit: 2014-11-19 | Discharge: 2014-11-19 | Disposition: A | Discharge status: Home / Self Care | Payer: Medicare Other | Attending: Emergency Medicine | Admitting: Emergency Medicine

## 2014-11-19 ENCOUNTER — Encounter (HOSPITAL_COMMUNITY): Payer: Self-pay | Admitting: Emergency Medicine

## 2014-11-19 DIAGNOSIS — K5900 Constipation, unspecified: Secondary | ICD-10-CM | POA: Diagnosis not present

## 2014-11-19 DIAGNOSIS — M40204 Unspecified kyphosis, thoracic region: Secondary | ICD-10-CM | POA: Insufficient documentation

## 2014-11-19 DIAGNOSIS — W01198A Fall on same level from slipping, tripping and stumbling with subsequent striking against other object, initial encounter: Secondary | ICD-10-CM | POA: Diagnosis not present

## 2014-11-19 DIAGNOSIS — M1990 Unspecified osteoarthritis, unspecified site: Secondary | ICD-10-CM | POA: Diagnosis not present

## 2014-11-19 DIAGNOSIS — I2510 Atherosclerotic heart disease of native coronary artery without angina pectoris: Secondary | ICD-10-CM | POA: Insufficient documentation

## 2014-11-19 DIAGNOSIS — Y9289 Other specified places as the place of occurrence of the external cause: Secondary | ICD-10-CM | POA: Insufficient documentation

## 2014-11-19 DIAGNOSIS — Z87891 Personal history of nicotine dependence: Secondary | ICD-10-CM | POA: Diagnosis not present

## 2014-11-19 DIAGNOSIS — Y92129 Unspecified place in nursing home as the place of occurrence of the external cause: Secondary | ICD-10-CM | POA: Diagnosis not present

## 2014-11-19 DIAGNOSIS — E860 Dehydration: Secondary | ICD-10-CM | POA: Diagnosis not present

## 2014-11-19 DIAGNOSIS — F0390 Unspecified dementia without behavioral disturbance: Secondary | ICD-10-CM | POA: Diagnosis not present

## 2014-11-19 DIAGNOSIS — Z79899 Other long term (current) drug therapy: Secondary | ICD-10-CM | POA: Diagnosis not present

## 2014-11-19 DIAGNOSIS — Z7982 Long term (current) use of aspirin: Secondary | ICD-10-CM | POA: Insufficient documentation

## 2014-11-19 DIAGNOSIS — Y998 Other external cause status: Secondary | ICD-10-CM | POA: Diagnosis not present

## 2014-11-19 DIAGNOSIS — F329 Major depressive disorder, single episode, unspecified: Secondary | ICD-10-CM | POA: Insufficient documentation

## 2014-11-19 DIAGNOSIS — K219 Gastro-esophageal reflux disease without esophagitis: Secondary | ICD-10-CM | POA: Diagnosis not present

## 2014-11-19 DIAGNOSIS — Z043 Encounter for examination and observation following other accident: Secondary | ICD-10-CM | POA: Diagnosis present

## 2014-11-19 LAB — CBC WITH DIFFERENTIAL/PLATELET
BASOS ABS: 0 10*3/uL (ref 0.0–0.1)
Basophils Relative: 1 % (ref 0–1)
EOS ABS: 0.4 10*3/uL (ref 0.0–0.7)
EOS PCT: 6 % — AB (ref 0–5)
HCT: 35.7 % — ABNORMAL LOW (ref 36.0–46.0)
HEMOGLOBIN: 11.5 g/dL — AB (ref 12.0–15.0)
Lymphocytes Relative: 33 % (ref 12–46)
Lymphs Abs: 1.8 10*3/uL (ref 0.7–4.0)
MCH: 31 pg (ref 26.0–34.0)
MCHC: 32.2 g/dL (ref 30.0–36.0)
MCV: 96.2 fL (ref 78.0–100.0)
MONO ABS: 0.5 10*3/uL (ref 0.1–1.0)
Monocytes Relative: 10 % (ref 3–12)
NEUTROS PCT: 50 % (ref 43–77)
Neutro Abs: 2.7 10*3/uL (ref 1.7–7.7)
PLATELETS: 161 10*3/uL (ref 150–400)
RBC: 3.71 MIL/uL — ABNORMAL LOW (ref 3.87–5.11)
RDW: 14 % (ref 11.5–15.5)
WBC: 5.5 10*3/uL (ref 4.0–10.5)

## 2014-11-19 LAB — BASIC METABOLIC PANEL
ANION GAP: 10 (ref 5–15)
BUN: 34 mg/dL — AB (ref 6–23)
CHLORIDE: 105 mmol/L (ref 96–112)
CO2: 22 mmol/L (ref 19–32)
Calcium: 8.9 mg/dL (ref 8.4–10.5)
Creatinine, Ser: 2.1 mg/dL — ABNORMAL HIGH (ref 0.50–1.10)
GFR calc Af Amer: 22 mL/min — ABNORMAL LOW (ref >90)
GFR, EST NON AFRICAN AMERICAN: 19 mL/min — AB (ref >90)
GLUCOSE: 114 mg/dL — AB (ref 70–99)
Potassium: 5.1 mmol/L (ref 3.5–5.1)
Sodium: 137 mmol/L (ref 135–145)

## 2014-11-19 LAB — CULTURE, BLOOD (ROUTINE X 2)
Culture: NO GROWTH
Culture: NO GROWTH

## 2014-11-19 MED ORDER — SODIUM CHLORIDE 0.9 % IV BOLUS (SEPSIS)
1000.0000 mL | Freq: Once | INTRAVENOUS | Status: AC
  Administered 2014-11-19: 1000 mL via INTRAVENOUS

## 2014-11-19 NOTE — ED Notes (Signed)
[facility]. Larey SeatFell backwards getting out of a chair and hit her head. No LOC. Denies pain. Sent in for eval. No blood thinners. No deformity noted. Alert. Oriented per norm.

## 2014-11-19 NOTE — ED Provider Notes (Signed)
CSN: [id number]     Arrival date & time 11/19/14  1848 History   First MD Initiated Contact with Patient 11/19/14 1848     Chief Complaint  Patient presents with  . Fall   HPI Patient was sent into the emergency room for evaluation of a fall. Patient has history of dementia. She cannot tell me what occurred. She lives at [facility]. According to the EMS report she fell backwards getting out of a chair and hit her head. According to the report there is no loss of consciousness. The patient was sent in for evaluation by the facility. Patient denies any complaints. She denies any headache or neck pain. She wants to go back to her home. Past Medical History  Diagnosis Date  . Depression   . GERD (gastroesophageal reflux disease)   . Dementia   . CAD (coronary artery disease)   . Cardiomyopathy   . Osteoporosis   . Constipation   . Failure to thrive in childhood   . Arthritis    Past Surgical History  Procedure Laterality Date  . Appendectomy     Family History  Problem Relation Age of Onset  . Alzheimer's disease    . Coronary artery disease     History  Substance Use Topics  . Smoking status: Former Smoker    Quit date: 10/25/2002  . Smokeless tobacco: Never Used  . Alcohol Use: No   OB History    No data available     Review of Systems  All other systems reviewed and are negative.     Allergies  Review of patient's allergies indicates no known allergies.  Home Medications   Prior to Admission medications   Medication Sig Start Date End Date Taking? Authorizing Provider  aspirin 81 MG chewable tablet Chew 81 mg by mouth daily.    Yes Historical Provider, MD  cefUROXime (CEFTIN) 500 MG tablet Take 1 tablet (500 mg total) by mouth 2 (two) times daily with a meal to complete after last dose on 11/21/14 11/15/14  Yes Gwen Pounds, MD  donepezil (ARICEPT) 5 MG tablet Take 5 mg by mouth at bedtime.   Yes Historical Provider, MD  furosemide (LASIX) 80 MG tablet Take  0.5 tablets (40 mg total) by mouth daily. 11/15/14  Yes Gwen Pounds, MD  haloperidol (HALDOL) 1 MG tablet Take 1 mg by mouth at bedtime.    Yes Historical Provider, MD  ketoconazole (NIZORAL) 2 % cream Apply 1 application topically 2 (two) times daily as needed for irritation.   Yes Historical Provider, MD  losartan (COZAAR) 50 MG tablet Take 50 mg by mouth daily.    Yes Historical Provider, MD  nystatin (MYCOSTATIN/NYSTOP) 100000 UNIT/GM POWD Apply 1 g topically 3 (three) times daily. Apply under breasts 09/06/13  Yes Historical Provider, MD  omeprazole (PRILOSEC) 20 MG capsule Take 20 mg by mouth daily at 6 (six) AM.    Yes Historical Provider, MD  potassium chloride (MICRO-K) 10 MEQ CR capsule Take 20 mEq by mouth daily.   Yes Historical Provider, MD  prochlorperazine (COMPAZINE) 25 MG suppository Place 25 mg rectally every 12 (twelve) hours as needed for nausea or vomiting.   Yes Historical Provider, MD  Protein (PROCEL) POWD Take 2 scoop by mouth 3 (three) times daily. Mix 2 scoops in 4 oz of milk/juice and drink at 7am, 2pm and 8pm   Yes Historical Provider, MD  Psyllium (METAMUCIL PO) Take 1 scoop by mouth daily at 6 (six)  AM. Mix in liquid and drink - for gas   Yes Historical Provider, MD  raloxifene (EVISTA) 60 MG tablet Take 60 mg by mouth daily.    Yes Historical Provider, MD  sertraline (ZOLOFT) 25 MG tablet Take 25 mg by mouth at bedtime.   Yes Historical Provider, MD  triamcinolone cream (KENALOG) 0.1 % Apply 1 application topically 3 (three) times daily.   Yes Historical Provider, MD  acetaminophen (TYLENOL) 325 MG tablet Take 650 mg by mouth every 6 (six) hours as needed (pain).     Historical Provider, MD  albuterol-ipratropium (COMBIVENT) 18-103 MCG/ACT inhaler Inhale 2 puffs into the lungs every 6 (six) hours as needed for wheezing or shortness of breath. 11/18/14   Gwen Pounds, MD  benzonatate (TESSALON) 200 MG capsule Take 200 mg by mouth 3 (three) times daily as needed for cough.     Historical Provider, MD   BP 168/74 mmHg  Pulse 65  Temp(Src) 98.1 F (36.7 C) (Oral)  Resp 22  SpO2 100% Physical Exam  Constitutional: No distress.  Elderly, frail  HENT:  Head: Normocephalic and atraumatic. Head is without raccoon's eyes, without Battle's sign, without abrasion and without contusion.  Right Ear: External ear normal.  Left Ear: External ear normal.  No hematoma of the scalp  Eyes: Conjunctivae are normal. Right eye exhibits no discharge. Left eye exhibits no discharge. No scleral icterus.  Neck: Neck supple. No tracheal deviation present.  Cardiovascular: Normal rate, regular rhythm and intact distal pulses.   Pulmonary/Chest: Effort normal and breath sounds normal. No stridor. No respiratory distress. She has no wheezes. She has no rales.  Abdominal: Soft. Bowel sounds are normal. She exhibits no distension. There is no tenderness. There is no rebound and no guarding.  Musculoskeletal: She exhibits no edema or tenderness.       Cervical back: She exhibits no tenderness and no bony tenderness.       Thoracic back: She exhibits no tenderness and no bony tenderness.       Lumbar back: She exhibits no bony tenderness and no swelling.  Thoracic kyphosis  Neurological: She is alert. She has normal strength. No cranial nerve deficit (no facial droop, extraocular movements intact, no slurred speech) or sensory deficit. She exhibits normal muscle tone. She displays no seizure activity. Coordination normal.  Skin: Skin is warm. No rash noted. She is not diaphoretic.  Psychiatric: She has a normal mood and affect.  Nursing note and vitals reviewed.   ED Course  Procedures (including critical care time) Labs Review Labs Reviewed  CBC WITH DIFFERENTIAL/PLATELET - Abnormal; Notable for the following:    RBC 3.71 (*)    Hemoglobin 11.5 (*)    HCT 35.7 (*)    Eosinophils Relative 6 (*)    All other components within normal limits  BASIC METABOLIC PANEL - Abnormal;  Notable for the following:    Glucose, Bld 114 (*)    BUN 34 (*)    Creatinine, Ser 2.10 (*)    GFR calc non Af Amer 19 (*)    GFR calc Af Amer 22 (*)    All other components within normal limits    Imaging Review Ct Head Wo Contrast  11/19/2014   CLINICAL DATA:  Recent fall backwards without loss of consciousness, initial encounter  EXAM: CT HEAD WITHOUT CONTRAST  TECHNIQUE: Contiguous axial images were obtained from the base of the skull through the vertex without intravenous contrast.  COMPARISON:  12/28/2013  FINDINGS: The  bony calvarium is intact. Opacification of the right maxillary antra is now not seen increased from the prior exam there remains a soft tissue lesion within the right nasal passages suggestive of nasal polyp. The remainder the paranasal sinuses are within normal limits. Diffuse atrophic changes and chronic white matter ischemic change is noted. No findings to suggest acute hemorrhage, acute infarction or space-occupying mass lesion are noted.  IMPRESSION: Chronic atrophic and ischemic changes. No acute intracranial abnormality is noted.  Increased opacification of the right maxillary antrum.   Electronically Signed   By: Alcide CleverMark  Lukens M.D.   On: 11/19/2014 20:47    Medications  sodium chloride 0.9 % bolus 1,000 mL (1,000 mLs Intravenous New Bag/Given 11/19/14 2148)     MDM   Final diagnoses:  Dehydration    CT scan without acute injury.  Labs show acute renal insufficiency compared to recent labs.  Cr up to 2.10.  Was 1.22 one day ago.  Pt may not be taking in enough fluids.  Will given bolus in the ED.  Discuss with her PCP but most likely can be managed as an outpatient.  Discussed with Dr Clelia CroftShaw.  They will follow up with patient    Linwood DibblesJon Caster [place], MD 11/19/14 2249

## 2014-11-19 NOTE — ED Notes (Signed)
Arline AspCindy ([facility] call to request an update on the patient.

## 2014-11-19 NOTE — Progress Notes (Signed)
CSW met with patient at bedside. There was no family present. Patient confirms that she is from [facility]. Patient presents to the ED due to fall while at the facility. Patient stated she is not sure what caused her to fall. Patient informed CSW that she is not in pain right now.  Per note, patient fell backwards getting out of a chair and hit her head.  According to patient, she receives assistance from staff at the facility with her ADL's.   Willette Brace [phone] ED CSW 11/19/2014 10:57 PM

## 2014-11-19 NOTE — Discharge Instructions (Signed)
Dehydration, Adult °Dehydration is when you lose more fluids from the body than you take in. Vital organs like the kidneys, brain, and heart cannot function without a proper amount of fluids and salt. Any loss of fluids from the body can cause dehydration.  °CAUSES  °· Vomiting. °· Diarrhea. °· Excessive sweating. °· Excessive urine output. °· Fever. °SYMPTOMS  °Mild dehydration °· Thirst. °· Dry lips. °· Slightly dry mouth. °Moderate dehydration °· Very dry mouth. °· Sunken eyes. °· Skin does not bounce back quickly when lightly pinched and released. °· Dark urine and decreased urine production. °· Decreased tear production. °· Headache. °Severe dehydration °· Very dry mouth. °· Extreme thirst. °· Rapid, weak pulse (more than 100 beats per minute at rest). °· Cold hands and feet. °· Not able to sweat in spite of heat and temperature. °· Rapid breathing. °· Blue lips. °· Confusion and lethargy. °· Difficulty being awakened. °· Minimal urine production. °· No tears. °DIAGNOSIS  °Your caregiver will diagnose dehydration based on your symptoms and your exam. Blood and urine tests will help confirm the diagnosis. The diagnostic evaluation should also identify the cause of dehydration. °TREATMENT  °Treatment of mild or moderate dehydration can often be done at home by increasing the amount of fluids that you drink. It is best to drink small amounts of fluid more often. Drinking too much at one time can make vomiting worse. Refer to the home care instructions below. °Severe dehydration needs to be treated at the hospital where you will probably be given intravenous (IV) fluids that contain water and electrolytes. °HOME CARE INSTRUCTIONS  °· Ask your caregiver about specific rehydration instructions. °· Drink enough fluids to keep your urine clear or pale yellow. °· Drink small amounts frequently if you have nausea and vomiting. °· Eat as you normally do. °· Avoid: °¨ Foods or drinks high in sugar. °¨ Carbonated  drinks. °¨ Juice. °¨ Extremely hot or cold fluids. °¨ Drinks with caffeine. °¨ Fatty, greasy foods. °¨ Alcohol. °¨ Tobacco. °¨ Overeating. °¨ Gelatin desserts. °· Wash your hands well to avoid spreading bacteria and viruses. °· Only take over-the-counter or prescription medicines for pain, discomfort, or fever as directed by your caregiver. °· Ask your caregiver if you should continue all prescribed and over-the-counter medicines. °· Keep all follow-up appointments with your caregiver. °SEEK MEDICAL CARE IF: °· You have abdominal pain and it increases or stays in one area (localizes). °· You have a rash, stiff neck, or severe headache. °· You are irritable, sleepy, or difficult to awaken. °· You are weak, dizzy, or extremely thirsty. °SEEK IMMEDIATE MEDICAL CARE IF:  °· You are unable to keep fluids down or you get worse despite treatment. °· You have frequent episodes of vomiting or diarrhea. °· You have blood or green matter (bile) in your vomit. °· You have blood in your stool or your stool looks black and tarry. °· You have not urinated in 6 to 8 hours, or you have only urinated a small amount of very dark urine. °· You have a fever. °· You faint. °MAKE SURE YOU:  °· Understand these instructions. °· Will watch your condition. °· Will get help right away if you are not doing well or get worse. °Document Released: 10/11/2005 Document Revised: 01/03/2012 Document Reviewed: 05/31/2011 °ExitCare® Patient Information ©2015 [employer]. This information is not intended to replace advice given to you by your health care provider. Make sure you discuss any questions you have with your health care   provider.  Dehydration Dehydration is when you lose more fluids from the body than you take in. Vital organs such as the kidneys, brain, and heart cannot function without a proper amount of fluids and salt. Any loss of fluids from the body can cause dehydration.  Older adults are at a higher risk of dehydration than  younger adults. As we age, our bodies are less able to conserve water and do not respond to temperature changes as well. Also, older adults do not become thirsty as easily or quickly. Because of this, older adults often do not realize they need to increase fluids to avoid dehydration.  CAUSES   Vomiting.  Diarrhea.  Excessive sweating.  Excessive urination.  Fever.  Certain medicines, such as blood pressure medicines called diuretics.  Poorly controlled blood sugars. SIGNS AND SYMPTOMS  Mild dehydration:  Thirst.  Dry lips.  Slightly dry mouth. Moderate dehydration:  Very dry mouth.  Sunken eyes.  Skin does not bounce back quickly when lightly pinched and released.  Dark urine and decreased urine production.  Decreased tear production.  Headache. Severe dehydration:  Very dry mouth.  Extreme thirst.  Rapid, weak pulse (more than 100 beats per minute at rest).  Cold hands and feet.  Not able to sweat in spite of heat.  Rapid breathing.  Blue lips.  Confusion and lethargy.  Difficulty being awakened.  Minimal urine production.  No tears. DIAGNOSIS  Your health care provider will diagnose dehydration based on your symptoms and your exam. Blood and urine tests will help confirm the diagnosis. The diagnostic evaluation should also identify the cause of dehydration. TREATMENT  Treatment of mild or moderate dehydration can often be done at home by increasing the amount of fluids that you drink. It is best to drink small amounts of fluid more often. Drinking too much at one time can make vomiting worse. Severe dehydration needs to be treated at the hospital. You may be given IV fluids that contain water and electrolytes. HOME CARE INSTRUCTIONS   Ask your health care provider about specific rehydration instructions.  Drink enough fluids to keep your urine clear or pale yellow.  Drink small amounts frequently if you have nausea and vomiting.  Eat as you  normally do.  Avoid:  Foods or drinks high in sugar.  Carbonated drinks.  Juice.  Extremely hot or cold fluids.  Drinks with caffeine.  Fatty, greasy foods.  Alcohol.  Tobacco.  Overeating.  Gelatin desserts.  Wash your hands well to avoid spreading bacteria and viruses.  Only take over-the-counter or prescription medicines for pain, discomfort, or fever as directed by your health care provider.  Ask your health care provider if you should continue all prescribed and over-the-counter medicines.  Keep all follow-up appointments with your health care provider. SEEK MEDICAL CARE IF:  You have abdominal pain, and it increases or stays in one area (localizes).  You have a rash, stiff neck, or severe headache.  You are irritable, sleepy, or difficult to awaken.  You are weak, dizzy, or extremely thirsty.  You have a fever. SEEK IMMEDIATE MEDICAL CARE IF:   You are unable to keep fluids down, or you get worse despite treatment.  You have frequent episodes of vomiting or diarrhea.  You have blood or green matter (bile) in your vomit.  You have blood in your stool, or your stool looks black and tarry.  You have not urinated in 6-8 hours, or you have only urinated a small amount of very  dark urine.  You faint. MAKE SURE YOU:   Understand these instructions.  Will watch your condition.  Will get help right away if you are not doing well or get worse. Document Released: 01/01/2004 Document Revised: 10/16/2013 Document Reviewed: 06/18/2013 [facility] Patient Information 2015 [place]. This information is not intended to replace advice given to you by your health care provider. Make sure you discuss any questions you have with your health care provider.

## 2014-11-19 NOTE — ED Notes (Signed)
Bed: [facility]ALC Expected date:  Expected time:  Means of arrival:  Comments: EMS-fall 

## 2014-11-19 NOTE — ED Notes (Signed)
Bed: [id number] Expected date:  Expected time:  Means of arrival:  Comments: For pt in hall C

## 2015-07-25 ENCOUNTER — Emergency Department (HOSPITAL_COMMUNITY)
Admission: EM | Admit: 2015-07-25 | Discharge: 2015-07-25 | Disposition: A | Discharge status: Home / Self Care | Payer: Medicare Other | Attending: Emergency Medicine | Admitting: Emergency Medicine

## 2015-07-25 ENCOUNTER — Emergency Department (HOSPITAL_COMMUNITY): Payer: Medicare Other

## 2015-07-25 ENCOUNTER — Encounter (HOSPITAL_COMMUNITY): Payer: Self-pay | Admitting: Emergency Medicine

## 2015-07-25 DIAGNOSIS — I8310 Varicose veins of unspecified lower extremity with inflammation: Secondary | ICD-10-CM

## 2015-07-25 DIAGNOSIS — Z9889 Other specified postprocedural states: Secondary | ICD-10-CM | POA: Diagnosis not present

## 2015-07-25 DIAGNOSIS — F0390 Unspecified dementia without behavioral disturbance: Secondary | ICD-10-CM | POA: Insufficient documentation

## 2015-07-25 DIAGNOSIS — W01198A Fall on same level from slipping, tripping and stumbling with subsequent striking against other object, initial encounter: Secondary | ICD-10-CM | POA: Insufficient documentation

## 2015-07-25 DIAGNOSIS — Y9389 Activity, other specified: Secondary | ICD-10-CM | POA: Insufficient documentation

## 2015-07-25 DIAGNOSIS — Y9289 Other specified places as the place of occurrence of the external cause: Secondary | ICD-10-CM | POA: Insufficient documentation

## 2015-07-25 DIAGNOSIS — Y998 Other external cause status: Secondary | ICD-10-CM | POA: Diagnosis not present

## 2015-07-25 DIAGNOSIS — I2510 Atherosclerotic heart disease of native coronary artery without angina pectoris: Secondary | ICD-10-CM | POA: Diagnosis not present

## 2015-07-25 DIAGNOSIS — Z7982 Long term (current) use of aspirin: Secondary | ICD-10-CM | POA: Diagnosis not present

## 2015-07-25 DIAGNOSIS — M810 Age-related osteoporosis without current pathological fracture: Secondary | ICD-10-CM | POA: Insufficient documentation

## 2015-07-25 DIAGNOSIS — I872 Venous insufficiency (chronic) (peripheral): Secondary | ICD-10-CM | POA: Insufficient documentation

## 2015-07-25 DIAGNOSIS — S0181XA Laceration without foreign body of other part of head, initial encounter: Secondary | ICD-10-CM | POA: Insufficient documentation

## 2015-07-25 DIAGNOSIS — Z87891 Personal history of nicotine dependence: Secondary | ICD-10-CM | POA: Diagnosis not present

## 2015-07-25 DIAGNOSIS — K219 Gastro-esophageal reflux disease without esophagitis: Secondary | ICD-10-CM | POA: Diagnosis not present

## 2015-07-25 DIAGNOSIS — Z8659 Personal history of other mental and behavioral disorders: Secondary | ICD-10-CM | POA: Insufficient documentation

## 2015-07-25 DIAGNOSIS — R600 Localized edema: Secondary | ICD-10-CM

## 2015-07-25 DIAGNOSIS — Z7952 Long term (current) use of systemic steroids: Secondary | ICD-10-CM | POA: Insufficient documentation

## 2015-07-25 DIAGNOSIS — S0191XA Laceration without foreign body of unspecified part of head, initial encounter: Secondary | ICD-10-CM

## 2015-07-25 DIAGNOSIS — Z79899 Other long term (current) drug therapy: Secondary | ICD-10-CM | POA: Diagnosis not present

## 2015-07-25 DIAGNOSIS — M1990 Unspecified osteoarthritis, unspecified site: Secondary | ICD-10-CM | POA: Insufficient documentation

## 2015-07-25 LAB — CBC WITH DIFFERENTIAL/PLATELET
Basophils Absolute: 0 10*3/uL (ref 0.0–0.1)
Basophils Relative: 0 %
Eosinophils Absolute: 0 10*3/uL (ref 0.0–0.7)
Eosinophils Relative: 0 %
HCT: 33.4 % — ABNORMAL LOW (ref 36.0–46.0)
Hemoglobin: 10.9 g/dL — ABNORMAL LOW (ref 12.0–15.0)
LYMPHS ABS: 0.8 10*3/uL (ref 0.7–4.0)
LYMPHS PCT: 10 %
MCH: 31.4 pg (ref 26.0–34.0)
MCHC: 32.6 g/dL (ref 30.0–36.0)
MCV: 96.3 fL (ref 78.0–100.0)
MONO ABS: 0.8 10*3/uL (ref 0.1–1.0)
Monocytes Relative: 9 %
NEUTROS ABS: 6.6 10*3/uL (ref 1.7–7.7)
Neutrophils Relative %: 81 %
Platelets: 116 10*3/uL — ABNORMAL LOW (ref 150–400)
RBC: 3.47 MIL/uL — ABNORMAL LOW (ref 3.87–5.11)
RDW: 14.2 % (ref 11.5–15.5)
WBC: 8.2 10*3/uL (ref 4.0–10.5)

## 2015-07-25 LAB — COMPREHENSIVE METABOLIC PANEL
ALT: 17 U/L (ref 14–54)
AST: 28 U/L (ref 15–41)
Albumin: 4.2 g/dL (ref 3.5–5.0)
Alkaline Phosphatase: 56 U/L (ref 38–126)
Anion gap: 5 (ref 5–15)
BILIRUBIN TOTAL: 1.3 mg/dL — AB (ref 0.3–1.2)
BUN: 26 mg/dL — ABNORMAL HIGH (ref 6–20)
CO2: 24 mmol/L (ref 22–32)
Calcium: 8.7 mg/dL — ABNORMAL LOW (ref 8.9–10.3)
Chloride: 111 mmol/L (ref 101–111)
Creatinine, Ser: 1.22 mg/dL — ABNORMAL HIGH (ref 0.44–1.00)
GFR calc Af Amer: 43 mL/min — ABNORMAL LOW (ref >60)
GFR, EST NON AFRICAN AMERICAN: 37 mL/min — AB (ref >60)
Glucose, Bld: 120 mg/dL — ABNORMAL HIGH (ref 65–99)
POTASSIUM: 4.3 mmol/L (ref 3.5–5.1)
Sodium: 140 mmol/L (ref 135–145)
TOTAL PROTEIN: 6.9 g/dL (ref 6.5–8.1)

## 2015-07-25 LAB — I-STAT CG4 LACTIC ACID, ED: LACTIC ACID, VENOUS: 1.26 mmol/L (ref 0.5–2.0)

## 2015-07-25 MED ORDER — FUROSEMIDE 40 MG PO TABS: 40.0000 mg | ORAL_TABLET | Freq: Every day | ORAL | Status: AC

## 2015-07-25 MED ORDER — FUROSEMIDE 20 MG PO TABS: 40.0000 mg | ORAL_TABLET | Freq: Two times a day (BID) | ORAL | Status: AC

## 2015-07-25 MED ORDER — LIDOCAINE-EPINEPHRINE 1 %-1:100000 IJ SOLN
10.0000 mL | Freq: Once | INTRAMUSCULAR | Status: AC
  Administered 2015-07-25: 10 mL via INTRADERMAL
  Filled 2015-07-25: qty 1

## 2015-07-25 NOTE — ED Notes (Signed)
[facility] called for transport.  

## 2015-07-25 NOTE — ED Provider Notes (Addendum)
CSN: [id number]     Arrival date & time 07/25/15  1252 History   First MD Initiated Contact with Patient 07/25/15 1256     Chief Complaint  Patient presents with  . Head Laceration     (Consider location/radiation/quality/duration/timing/severity/associated sxs/prior Treatment) HPI Comments: Nothing to indicate pt fell per staff, blood on the pillow, blood in her nails, unsure if she scratched herself. Pt initially said she fell, then said she hit herself in the head.  Found sitting in bed, laying back towards pillow. Otherwise she is at her baseline, (yelling help frequently)  No other symptoms/no n/v/d/black or bloody stool, eating normally for her.  Patient is a 81 y.o. female presenting with scalp laceration.  Head Laceration This is a new problem. The current episode started less than 1 hour ago. The problem occurs constantly. The problem has not changed since onset.Nothing aggravates the symptoms. Nothing relieves the symptoms. She has tried nothing for the symptoms. The treatment provided no relief.    Past Medical History  Diagnosis Date  . Depression   . GERD (gastroesophageal reflux disease)   . Dementia   . CAD (coronary artery disease)   . Cardiomyopathy   . Osteoporosis   . Constipation   . Failure to thrive in childhood   . Arthritis    Past Surgical History  Procedure Laterality Date  . Appendectomy     Family History  Problem Relation Age of Onset  . Alzheimer's disease    . Coronary artery disease     Social History  Substance Use Topics  . Smoking status: Former Smoker    Quit date: 10/25/2002  . Smokeless tobacco: Never Used  . Alcohol Use: No   OB History    No data available     Review of Systems  Unable to perform ROS: Dementia      Allergies  Review of patient's allergies indicates no known allergies.  Home Medications   Prior to Admission medications   Medication Sig Start Date End Date Taking? Authorizing Kentavius Dettore   acetaminophen (TYLENOL) 325 MG tablet Take 650 mg by mouth every 6 (six) hours as needed (pain).    Yes Historical Raniya Golembeski, MD  aspirin 81 MG chewable tablet Chew 81 mg by mouth daily.    Yes Historical Annastacia Duba, MD  benzonatate (TESSALON) 200 MG capsule Take 200 mg by mouth 3 (three) times daily as needed for cough.   Yes Historical Cire Deyarmin, MD  donepezil (ARICEPT) 5 MG tablet Take 5 mg by mouth at bedtime.   Yes Historical Kaelob Persky, MD  haloperidol (HALDOL) 1 MG tablet Take 1 mg by mouth daily as needed for agitation.    Yes Historical Tian Davison, MD  haloperidol (HALDOL) 1 MG tablet Take 1 mg by mouth at bedtime.   Yes Historical Mirza Fessel, MD  ketoconazole (NIZORAL) 2 % cream Apply 1 application topically 2 (two) times daily as needed for irritation.   Yes Historical Kharee Lesesne, MD  losartan (COZAAR) 50 MG tablet Take 50 mg by mouth daily.    Yes Historical Amarri Michaelson, MD  nystatin (MYCOSTATIN/NYSTOP) 100000 UNIT/GM POWD Apply 1 g topically 3 (three) times daily. Apply under breasts 09/06/13  Yes Historical Isobella Ascher, MD  omeprazole (PRILOSEC) 20 MG capsule Take 20 mg by mouth daily with breakfast.    Yes Historical Raeshawn Vo, MD  potassium chloride (MICRO-K) 10 MEQ CR capsule Take 20 mEq by mouth daily.   Yes Historical Otniel Hoe, MD  prochlorperazine (COMPAZINE) 25 MG suppository Place 25 mg rectally every 12 (  twelve) hours as needed for nausea or vomiting.   Yes Historical Hansford Hirt, MD  Protein (PROCEL) POWD Take 2 scoop by mouth 3 (three) times daily. Mix 2 scoops in 4 oz of milk/juice and drink at 7am, 2pm and 8pm   Yes Historical Lanisa Ishler, MD  Psyllium (METAMUCIL PO) Take 1 scoop by mouth daily at 6 (six) AM. Mix in liquid and drink - for gas   Yes Historical Riely Baskett, MD  raloxifene (EVISTA) 60 MG tablet Take 60 mg by mouth daily.    Yes Historical Cheria Sadiq, MD  triamcinolone cream (KENALOG) 0.1 % Apply 1 application topically 3 (three) times daily.   Yes Historical Issak Goley, MD   albuterol-ipratropium (COMBIVENT) 18-103 MCG/ACT inhaler Inhale 2 puffs into the lungs every 6 (six) hours as needed for wheezing or shortness of breath. Patient not taking: Reported on 07/25/2015 11/18/14   Creola Corn, MD  cefUROXime (CEFTIN) 500 MG tablet Take 1 tablet (500 mg total) by mouth 2 (two) times daily with a meal to complete after last dose on 11/21/14 Patient not taking: Reported on 07/25/2015 11/15/14   Creola Corn, MD  furosemide (LASIX) 20 MG tablet Take 2 tablets (40 mg total) by mouth 2 (two) times daily. 07/25/15 07/28/15  Alvira Monday, MD  furosemide (LASIX) 40 MG tablet Take 1 tablet (40 mg total) by mouth daily. 07/25/15   Alvira Monday, MD   BP 180/82 mmHg  Pulse 81  Temp(Src) 97.5 F (36.4 C) (Oral)  Resp 16  SpO2 97% Physical Exam  Constitutional: She appears well-developed and well-nourished. No distress.  HENT:  Head: Normocephalic and atraumatic.  Eyes: Conjunctivae and EOM are normal.  Neck: Normal range of motion.  Cardiovascular: Normal rate, regular rhythm, normal heart sounds and intact distal pulses.  Exam reveals no gallop and no friction rub.   No murmur heard. Pulmonary/Chest: Effort normal and breath sounds normal. No respiratory distress. She has no wheezes. She has no rales.  Abdominal: Soft. She exhibits no distension. There is no tenderness. There is no guarding.  Musculoskeletal: She exhibits edema (3+bilaterally). She exhibits no tenderness.  Neurological: She is alert. GCS eye subscore is 4. GCS verbal subscore is 4. GCS motor subscore is 6.  Spasticity bilateral upper extremities (chronic)  Skin: Skin is warm and dry. No rash noted. She is not diaphoretic. There is erythema (erythema of bilateral lower extremities).  Dark flat blister LLE lateral, anterior LLE with small superficial ulcer  Nursing note and vitals reviewed.   ED Course  LACERATION REPAIR Date/Time: 07/28/2015 2:37 PM Performed by: Alvira Monday Authorized by:  Alvira Monday Consent: Verbal consent obtained. Risks and benefits: risks, benefits and alternatives were discussed Consent given by: power of attorney Required items: required blood products, implants, devices, and special equipment available Patient identity confirmed: verbally with patient Time out: Immediately prior to procedure a "time out" was called to verify the correct patient, procedure, equipment, support staff and site/side marked as required. Body area: head/neck Laceration length: 3 cm Foreign bodies: no foreign bodies Tendon involvement: none Nerve involvement: none Vascular damage: no Anesthesia: local infiltration Local anesthetic: lidocaine 1% with epinephrine Patient sedated: no Preparation: Patient was prepped and draped in the usual sterile fashion. Irrigation solution: saline Irrigation method: syringe Amount of cleaning: standard Debridement: none Degree of undermining: none Skin closure: staples Number of sutures: 3   (including critical care time) Labs Review Labs Reviewed  CBC WITH DIFFERENTIAL/PLATELET - Abnormal; Notable for the following:    RBC 3.47 (*)  Hemoglobin 10.9 (*)    HCT 33.4 (*)    Platelets 116 (*)    All other components within normal limits  COMPREHENSIVE METABOLIC PANEL - Abnormal; Notable for the following:    Glucose, Bld 120 (*)    BUN 26 (*)    Creatinine, Ser 1.22 (*)    Calcium 8.7 (*)    Total Bilirubin 1.3 (*)    GFR calc non Af Amer 37 (*)    GFR calc Af Amer 43 (*)    All other components within normal limits  I-STAT CG4 LACTIC ACID, ED    Imaging Review Ct Head Wo Contrast  07/25/2015   CLINICAL DATA:  Pain following fall.  History of dementia  EXAM: CT HEAD WITHOUT CONTRAST  CT CERVICAL SPINE WITHOUT CONTRAST  TECHNIQUE: Multidetector CT imaging of the head and cervical spine was performed following the standard protocol without intravenous contrast. Multiplanar CT image reconstructions of the cervical spine  were also generated.  COMPARISON:  Head CT November 19, 2014  FINDINGS: CT HEAD FINDINGS  Moderate diffuse atrophy is stable. There is no intracranial mass, hemorrhage, extra-axial fluid collection, or midline shift. There is moderate small vessel disease throughout the centra semiovale bilaterally. No new gray-white compartment lesions are identified. No acute infarct evident. The bony calvarium appears intact. Mastoid air cells are clear. There is a retention cyst in right maxillary antrum. There is opacification of several ethmoid air cells on the right. There is persistence of an apparent polyp arising in the inferior right ethmoid air cell complex and extending into the superior right nasal cavity measuring 2.2 x 1.6 cm.  CT CERVICAL SPINE FINDINGS  There is a mild degree of motion artifact making evaluation slightly less than optimal, particularly in the odontoid region. No fracture is apparent on this study. There is slight anterolisthesis of C5 on C6, C6 on C7, and C7 on T1, felt to be due to underlying spondylosis. Prevertebral soft tissues and predental space regions are normal. There is moderately severe disc space narrowing at C3-4. There is moderate disc space narrowing at C5-6 and C6-7. There is facet hypertrophy at most levels bilaterally. No frank disc extrusion or stenosis is seen. There is pannus posterior to C2 which is not impressing on the upper thoracic cord. There are foci of calcification in each carotid artery.  IMPRESSION: CT head: Atrophy with moderate periventricular small vessel disease. No intracranial mass, hemorrhage, or acute appearing infarct. No extra-axial fluid collection. Areas of paranasal sinus disease. There is a persistent polyp/polypoid lesion in the inferior right ethmoid air cell complex extending into the superior right nasal cavity. This finding may warrant ENT evaluation if this polyp has not been so evaluated previously.  CT cervical spine: Multilevel arthropathy. Mild  spondylolisthesis at several levels, felt to be due to underlying spondylosis. No fracture apparent. There is calcification in both carotid arteries.   Electronically Signed   By: Bretta Bang III M.D.   On: 07/25/2015 14:37   Ct Cervical Spine Wo Contrast  07/25/2015   CLINICAL DATA:  Pain following fall.  History of dementia  EXAM: CT HEAD WITHOUT CONTRAST  CT CERVICAL SPINE WITHOUT CONTRAST  TECHNIQUE: Multidetector CT imaging of the head and cervical spine was performed following the standard protocol without intravenous contrast. Multiplanar CT image reconstructions of the cervical spine were also generated.  COMPARISON:  Head CT November 19, 2014  FINDINGS: CT HEAD FINDINGS  Moderate diffuse atrophy is stable. There is no intracranial mass,  hemorrhage, extra-axial fluid collection, or midline shift. There is moderate small vessel disease throughout the centra semiovale bilaterally. No new gray-white compartment lesions are identified. No acute infarct evident. The bony calvarium appears intact. Mastoid air cells are clear. There is a retention cyst in right maxillary antrum. There is opacification of several ethmoid air cells on the right. There is persistence of an apparent polyp arising in the inferior right ethmoid air cell complex and extending into the superior right nasal cavity measuring 2.2 x 1.6 cm.  CT CERVICAL SPINE FINDINGS  There is a mild degree of motion artifact making evaluation slightly less than optimal, particularly in the odontoid region. No fracture is apparent on this study. There is slight anterolisthesis of C5 on C6, C6 on C7, and C7 on T1, felt to be due to underlying spondylosis. Prevertebral soft tissues and predental space regions are normal. There is moderately severe disc space narrowing at C3-4. There is moderate disc space narrowing at C5-6 and C6-7. There is facet hypertrophy at most levels bilaterally. No frank disc extrusion or stenosis is seen. There is pannus  posterior to C2 which is not impressing on the upper thoracic cord. There are foci of calcification in each carotid artery.  IMPRESSION: CT head: Atrophy with moderate periventricular small vessel disease. No intracranial mass, hemorrhage, or acute appearing infarct. No extra-axial fluid collection. Areas of paranasal sinus disease. There is a persistent polyp/polypoid lesion in the inferior right ethmoid air cell complex extending into the superior right nasal cavity. This finding may warrant ENT evaluation if this polyp has not been so evaluated previously.  CT cervical spine: Multilevel arthropathy. Mild spondylolisthesis at several levels, felt to be due to underlying spondylosis. No fracture apparent. There is calcification in both carotid arteries.   Electronically Signed   By: Bretta Bang III M.D.   On: 07/25/2015 14:37   I have personally reviewed and evaluated these images and lab results as part of my medical decision-making.   EKG Interpretation None      MDM   Final diagnoses:  Laceration of head, initial encounter  Bilateral lower extremity edema  Chronic venous stasis dermatitis, unspecified laterality   81 year old female with a history of dementia, GERD, CAD, chronic venous stasis presents from skilled nursing facility for concern of laceration to the head. Per nursing facility there not sure how this occurred. Given concern for possible fall CT head and cervical spine are ordered which were within normal limits. Wound was irrigated with normal saline and closed with 3 staples. Facility and family all report patient is at her baseline. Son reported that legs appeared to be more swollen from baseline and more erythematous.   Given bilateral nature, and chronicity have low suspicion this represents cellulitis. CBC showed no leukocytosis. Lactic acid negative. Patient is afebrile with normal vital signs. CMP shows Cr improved from prior, electrolytes WNL.  Discussed leg  swelling/erythema with PCP Dr. Timothy Lasso who reports this is a chronic ongoing issue and has been difficult to control given patient's dementia.  Pt family report she refused to elevate her legs at facility.    Per Dr. Ferd Hibbs recommendations given increased swelling per son will increase lasix for 3 days and resume normal dosing and have her follow up with him.  Patient should have staples removed in 7 days.  Patient discharged in stable condition with understanding of reasons to return.     Alvira Monday, MD 07/25/15 5621  Alvira Monday, MD 07/28/15 1438

## 2015-07-25 NOTE — ED Notes (Signed)
Pt has two dressings to left calf, redness to left calf and warm to touch. No acute wounds.

## 2015-07-25 NOTE — ED Notes (Signed)
Bed: ZO10 Expected date:  Expected time:  Means of arrival:  Comments: Ems-[id number] fall, laceration

## 2015-07-25 NOTE — ED Notes (Signed)
Pt presents from [facility] via EMS for head laceration. Staff unsure if pt fell. Pt was found sitting up in chair. Approximately 2" laceration to left posterior head. Pt alert and oriented to self. Hx of dementia.

## 2015-07-25 NOTE — Discharge Instructions (Signed)
Take  of lasix BID for 3 days, then resume  BID lasix and follow up with Dr. Timothy Lasso

## 2015-08-04 ENCOUNTER — Emergency Department (HOSPITAL_COMMUNITY): Payer: Medicare Other

## 2015-08-04 ENCOUNTER — Encounter (HOSPITAL_COMMUNITY): Payer: Self-pay | Admitting: Emergency Medicine

## 2015-08-04 ENCOUNTER — Emergency Department (HOSPITAL_COMMUNITY)
Admission: EM | Admit: 2015-08-04 | Discharge: 2015-08-04 | Disposition: A | Discharge status: Home / Self Care | Payer: Medicare Other | Attending: Emergency Medicine | Admitting: Emergency Medicine

## 2015-08-04 DIAGNOSIS — M1990 Unspecified osteoarthritis, unspecified site: Secondary | ICD-10-CM | POA: Diagnosis not present

## 2015-08-04 DIAGNOSIS — F0390 Unspecified dementia without behavioral disturbance: Secondary | ICD-10-CM | POA: Insufficient documentation

## 2015-08-04 DIAGNOSIS — Y9389 Activity, other specified: Secondary | ICD-10-CM | POA: Diagnosis not present

## 2015-08-04 DIAGNOSIS — Z7952 Long term (current) use of systemic steroids: Secondary | ICD-10-CM | POA: Diagnosis not present

## 2015-08-04 DIAGNOSIS — K219 Gastro-esophageal reflux disease without esophagitis: Secondary | ICD-10-CM | POA: Diagnosis not present

## 2015-08-04 DIAGNOSIS — Z79899 Other long term (current) drug therapy: Secondary | ICD-10-CM | POA: Insufficient documentation

## 2015-08-04 DIAGNOSIS — Z87891 Personal history of nicotine dependence: Secondary | ICD-10-CM | POA: Insufficient documentation

## 2015-08-04 DIAGNOSIS — W1839XA Other fall on same level, initial encounter: Secondary | ICD-10-CM | POA: Diagnosis not present

## 2015-08-04 DIAGNOSIS — L03116 Cellulitis of left lower limb: Secondary | ICD-10-CM | POA: Insufficient documentation

## 2015-08-04 DIAGNOSIS — Y92129 Unspecified place in nursing home as the place of occurrence of the external cause: Secondary | ICD-10-CM | POA: Insufficient documentation

## 2015-08-04 DIAGNOSIS — Z7982 Long term (current) use of aspirin: Secondary | ICD-10-CM | POA: Insufficient documentation

## 2015-08-04 DIAGNOSIS — S32502A Unspecified fracture of left pubis, initial encounter for closed fracture: Secondary | ICD-10-CM | POA: Diagnosis not present

## 2015-08-04 DIAGNOSIS — F329 Major depressive disorder, single episode, unspecified: Secondary | ICD-10-CM | POA: Diagnosis not present

## 2015-08-04 DIAGNOSIS — I2510 Atherosclerotic heart disease of native coronary artery without angina pectoris: Secondary | ICD-10-CM | POA: Diagnosis not present

## 2015-08-04 DIAGNOSIS — S32592A Other specified fracture of left pubis, initial encounter for closed fracture: Secondary | ICD-10-CM

## 2015-08-04 DIAGNOSIS — Y998 Other external cause status: Secondary | ICD-10-CM | POA: Insufficient documentation

## 2015-08-04 DIAGNOSIS — S79912A Unspecified injury of left hip, initial encounter: Secondary | ICD-10-CM | POA: Diagnosis present

## 2015-08-04 MED ORDER — HYDROCODONE-ACETAMINOPHEN 5-325 MG PO TABS: 1.0000 | ORAL_TABLET | Freq: Four times a day (QID) | ORAL | Status: AC | PRN

## 2015-08-04 MED ORDER — SULFAMETHOXAZOLE-TRIMETHOPRIM 800-160 MG PO TABS: 2.0000 | ORAL_TABLET | Freq: Two times a day (BID) | ORAL | Status: AC

## 2015-08-04 MED ORDER — HYDROCODONE-ACETAMINOPHEN 5-325 MG PO TABS
1.0000 | ORAL_TABLET | Freq: Once | ORAL | Status: AC
  Administered 2015-08-04: 1 via ORAL
  Filled 2015-08-04: qty 1

## 2015-08-04 NOTE — ED Notes (Signed)
Per EMS pt from [facility] at [place] c/o right hip pain, had fall 2 weeks ago, has staples in her head. Prior to fall pt was able to ambulate with walker, currently pt able to stand and pivot with assistance only. Hx of dementia, poor historian.

## 2015-08-04 NOTE — ED Notes (Signed)
Bed: [facility] Expected date:  Expected time:  Means of arrival:  Comments: EMS/29F/fall x 2 weeks ago/hip pain

## 2015-08-04 NOTE — ED Notes (Signed)
Using clean technique, removed 3 staples from vertex of head. Pt tolerated it well.

## 2015-08-04 NOTE — ED Notes (Signed)
Notified [facility] for transportation 

## 2015-08-04 NOTE — ED Notes (Signed)
Using clean technique, cleansed wound on posterior and anterior of left lower leg with wound cleaner. Applied telfa pad over wound areas, wrapped kerlix over site, and secured with tape. Pt tolerated it well.

## 2015-08-04 NOTE — Discharge Instructions (Signed)
Follow-up with the orthopedist provided for your orthopedist.  Follow-up with your primary care doctor.  Return here as needed

## 2015-08-04 NOTE — ED Provider Notes (Signed)
CSN: [id number]     Arrival date & time 08/04/15  1631 History   First MD Initiated Contact with Patient 08/04/15 1706     Chief Complaint  Patient presents with  . Hip Pain     (Consider location/radiation/quality/duration/timing/severity/associated sxs/prior Treatment) HPI Patient presents to the emergency department with left hip discomfort since a fall 2 weeks ago.  Patient lives at a nursing facility noted a fall 2 weeks ago.  Patient has been complaining of increasing pain in the left hip region.  Per the daughter.  The staff has had a hard time getting her dressed due to pain.  She is also now using a wheelchair instead of the walker.  She was previously walking with a walker.  Patient is unable to give any history Past Medical History  Diagnosis Date  . Depression   . GERD (gastroesophageal reflux disease)   . Dementia   . CAD (coronary artery disease)   . Cardiomyopathy   . Osteoporosis   . Constipation   . Failure to thrive in childhood   . Arthritis    Past Surgical History  Procedure Laterality Date  . Appendectomy     Family History  Problem Relation Age of Onset  . Alzheimer's disease    . Coronary artery disease     Social History  Substance Use Topics  . Smoking status: Former Smoker    Quit date: 10/25/2002  . Smokeless tobacco: Never Used  . Alcohol Use: No   OB History    No data available     Review of Systems  Level V caveat applies due to dementia  Allergies  Review of patient's allergies indicates no known allergies.  Home Medications   Prior to Admission medications   Medication Sig Start Date End Date Taking? Authorizing Provider  acetaminophen (TYLENOL) 325 MG tablet Take 650 mg by mouth every 6 (six) hours as needed (pain).    Yes Historical Provider, MD  aspirin 81 MG chewable tablet Chew 81 mg by mouth daily.    Yes Historical Provider, MD  benzonatate (TESSALON) 200 MG capsule Take 200 mg by mouth 3 (three) times daily as needed  for cough.   Yes Historical Provider, MD  donepezil (ARICEPT) 5 MG tablet Take 5 mg by mouth at bedtime.   Yes Historical Provider, MD  furosemide (LASIX) 40 MG tablet Take 1 tablet (40 mg total) by mouth daily. 07/25/15  Yes Alvira Monday, MD  haloperidol (HALDOL) 1 MG tablet Take 1 mg by mouth daily as needed for agitation.    Yes Historical Provider, MD  haloperidol (HALDOL) 1 MG tablet Take 2 mg by mouth at bedtime.    Yes Historical Provider, MD  ketoconazole (NIZORAL) 2 % cream Apply 1 application topically 2 (two) times daily as needed for irritation.   Yes Historical Provider, MD  losartan (COZAAR) 50 MG tablet Take 50 mg by mouth daily.    Yes Historical Provider, MD  nystatin (MYCOSTATIN/NYSTOP) 100000 UNIT/GM POWD Apply 1 g topically 3 (three) times daily. Apply under breasts 09/06/13  Yes Historical Provider, MD  omeprazole (PRILOSEC) 20 MG capsule Take 20 mg by mouth daily with breakfast.    Yes Historical Provider, MD  potassium chloride (MICRO-K) 10 MEQ CR capsule Take 20 mEq by mouth daily.   Yes Historical Provider, MD  prochlorperazine (COMPAZINE) 25 MG suppository Place 25 mg rectally every 12 (twelve) hours as needed for nausea or vomiting.   Yes Historical Provider, MD  Protein (PROCEL)  POWD Take 2 scoop by mouth 3 (three) times daily. Mix 2 scoops in 4 oz of milk/juice and drink at 7am, 2pm and 8pm   Yes Historical Provider, MD  Psyllium (METAMUCIL PO) Take 1 scoop by mouth daily at 6 (six) AM. Mix in liquid and drink - for gas   Yes Historical Provider, MD  raloxifene (EVISTA) 60 MG tablet Take 60 mg by mouth daily.    Yes Historical Provider, MD  sertraline (ZOLOFT) 25 MG tablet Take 25 mg by mouth daily.   Yes Historical Provider, MD  triamcinolone cream (KENALOG) 0.1 % Apply 1 application topically 3 (three) times daily.   Yes Historical Provider, MD  albuterol-ipratropium (COMBIVENT) 18-103 MCG/ACT inhaler Inhale 2 puffs into the lungs every 6 (six) hours as needed for  wheezing or shortness of breath. Patient not taking: Reported on 07/25/2015 11/18/14   Creola Corn, MD   BP 146/56 mmHg  Pulse 98  Temp(Src) 98.5 F (36.9 C) (Oral)  Resp 20  SpO2 96% Physical Exam  Constitutional: She is oriented to person, place, and time. She appears well-developed and well-nourished. No distress.  HENT:  Head: Normocephalic and atraumatic.  Eyes: Pupils are equal, round, and reactive to light.  Neck: Normal range of motion. Neck supple.  Cardiovascular: Normal rate, regular rhythm and normal heart sounds.  Exam reveals no gallop and no friction rub.   No murmur heard. Pulmonary/Chest: Effort normal and breath sounds normal. No respiratory distress. She has no wheezes.  Musculoskeletal:       Left hip: She exhibits tenderness. She exhibits normal range of motion, normal strength, no bony tenderness and no deformity.       Legs: Neurological: She is alert and oriented to person, place, and time. She exhibits normal muscle tone. Coordination normal.  Skin: Skin is warm and dry. No rash noted. No erythema.  Psychiatric: She has a normal mood and affect. Her behavior is normal.  Nursing note and vitals reviewed.   ED Course  Procedures (including critical care time) Labs Review Labs Reviewed - No data to display  Imaging Review Dg Hip Unilat With Pelvis 2-3 Views Left  08/04/2015   CLINICAL DATA:  Larey Seat 2 weeks ago, right hip pain  EXAM: DG HIP (WITH OR WITHOUT PELVIS) 2-3V LEFT  COMPARISON:  None.  FINDINGS: There are displaced fractures of the superior and inferior pubic rami on the left, which appear acute. They are mildly to moderately displaced fractures. The right pubic rami are intact. Right femur appears to be intact. Left femur shows no fracture or dislocation.  IMPRESSION: Acute displaced fractures of the left pubic rami  Comment:Clinical history indicates right hip pain, but this is a left hip radiographic series. Correlate clinically. Given the acute left  rami fractures, left sided pain seems likely.   Electronically Signed   By: Esperanza Heir M.D.   On: 08/04/2015 18:10   I have personally reviewed and evaluated these images and lab results as part of my medical decision-making.  Patient be treated for pubic rami fracture.  She is currently using a wheelchair.  She will set.  Given bed rest precautions.  Follow up with orthopedics.  Patient be treated for cellulitis of the lower extremities.  There is a wound is present with weeping with surrounding erythema The patient is present.  Wound care by home health at the facility.  They will be advised to continue the wound care   Charlestine Night, PA-C 08/05/15 2200  Arby Barrette,  MD 08/07/15 1416

## 2015-08-04 NOTE — ED Notes (Signed)
Attempting to call report to [place] on [facility] but would have to leave a message. Will provide discharge instructions for facility via [facility].

## 2015-11-26 DEATH — deceased

## 2016-04-26 IMAGING — CR DG CHEST 1V PORT
1 series · 1 of 1 positions shown · non-contrast
Comparison: 11/12/14

CLINICAL DATA: Subsequent evaluation for shortness of breath

EXAM:
PORTABLE CHEST - 1 VIEW

[AP]
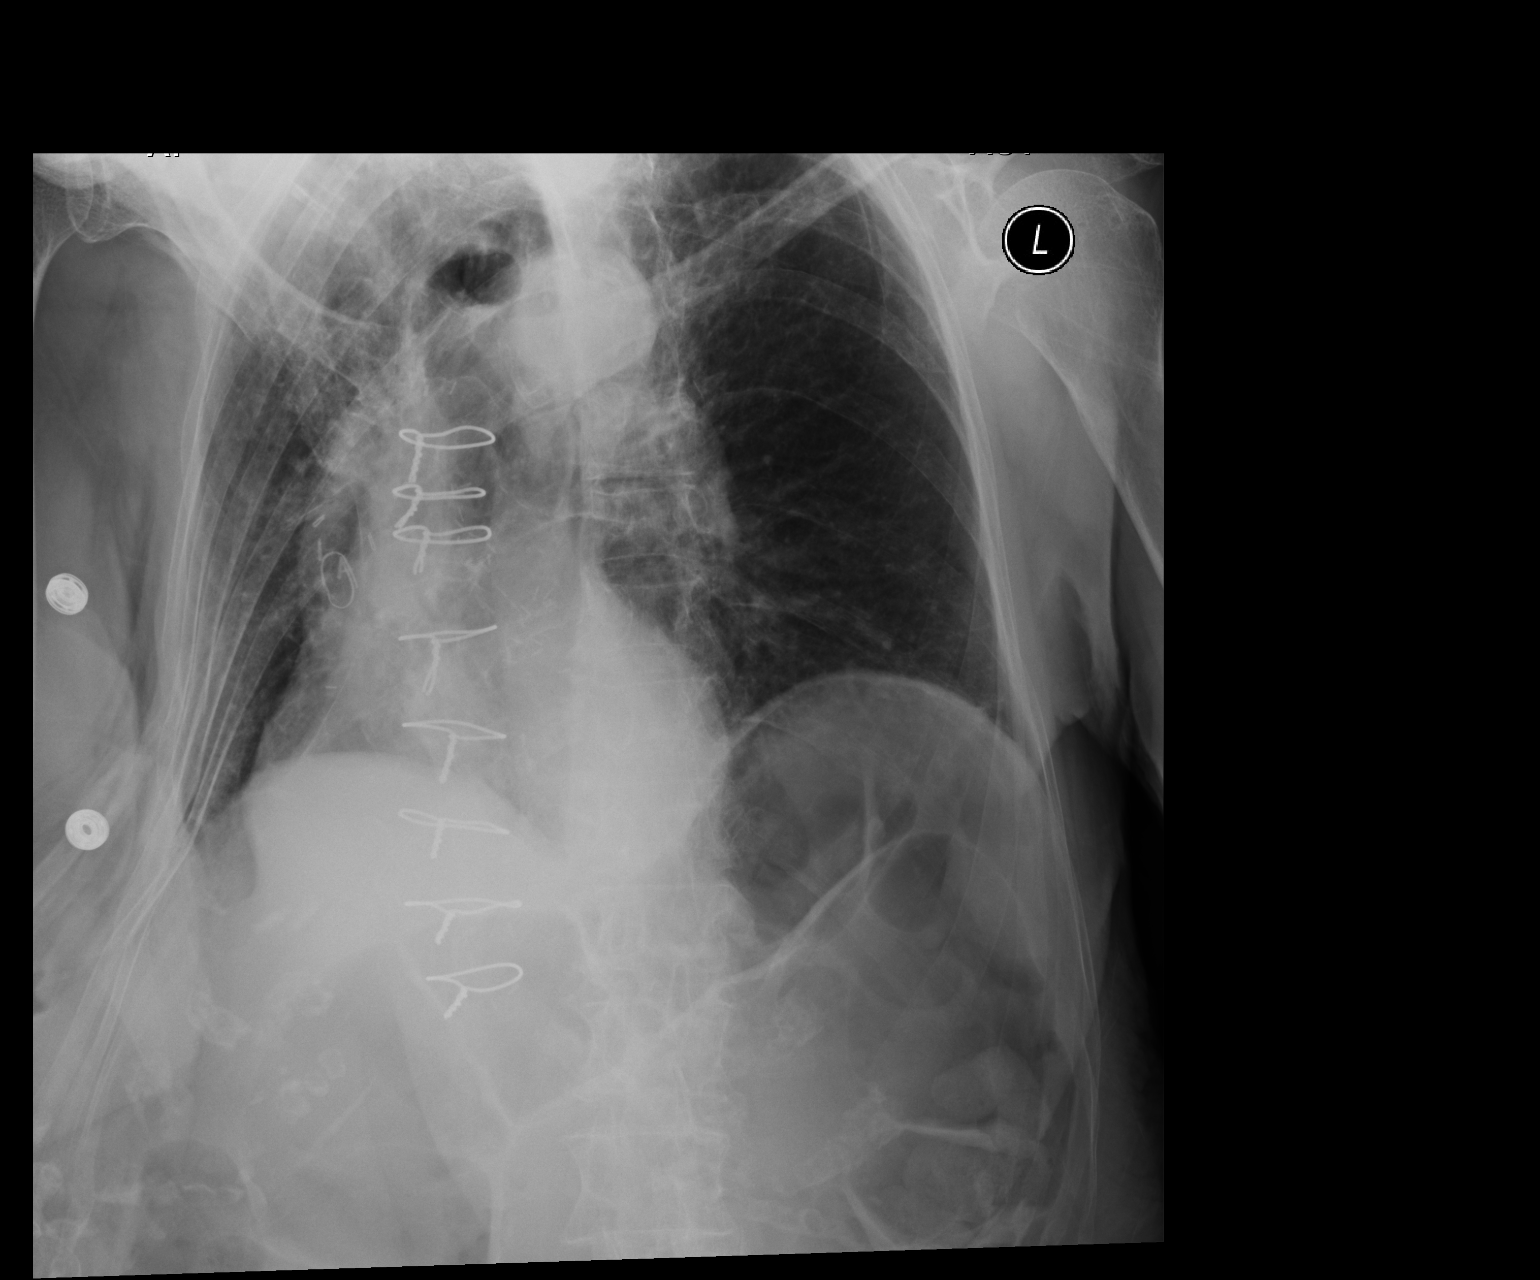

[1 of 1 positions shown; findings below may reference images not displayed]

FINDINGS: Study is very limited by rotation. Stable mild cardiac enlargement.
Allowing for this the lungs appear clear. Specifically, no
significant right lower lobe infiltrate or right pleural effusion is
appreciated.
IMPRESSION: Grossly no acute findings, although the study is significantly
limited by patient rotation.
